# Patient Record
Sex: Female | Born: 1995 | Race: White | Hispanic: No | Marital: Single | Smoking: Never smoker
Health system: Southern US, Community
[De-identification: ages and names within clinical notes are randomized; demographics above are authoritative.]

## PROBLEM LIST (undated history)

## (undated) ENCOUNTER — Emergency Department: Admission: EM | Payer: 59 | Source: Home / Self Care

## (undated) DIAGNOSIS — N200 Calculus of kidney: Secondary | ICD-10-CM

---

## 2018-09-20 ENCOUNTER — Emergency Department: Payer: 59

## 2018-09-20 ENCOUNTER — Other Ambulatory Visit: Payer: Self-pay

## 2018-09-20 ENCOUNTER — Emergency Department
Admission: EM | Admit: 2018-09-20 | Discharge: 2018-09-20 | Disposition: A | Discharge status: Home / Self Care | Payer: 59 | Attending: Emergency Medicine | Admitting: Emergency Medicine

## 2018-09-20 ENCOUNTER — Encounter: Payer: Self-pay | Admitting: *Deleted

## 2018-09-20 DIAGNOSIS — R079 Chest pain, unspecified: Secondary | ICD-10-CM | POA: Insufficient documentation

## 2018-09-20 LAB — CBC
HCT: 36.8 % (ref 36.0–46.0)
Hemoglobin: 11.8 g/dL — ABNORMAL LOW (ref 12.0–15.0)
MCH: 25.8 pg — ABNORMAL LOW (ref 26.0–34.0)
MCHC: 32.1 g/dL (ref 30.0–36.0)
MCV: 80.5 fL (ref 80.0–100.0)
Platelets: 281 10*3/uL (ref 150–400)
RBC: 4.57 MIL/uL (ref 3.87–5.11)
RDW: 13.2 % (ref 11.5–15.5)
WBC: 6.1 10*3/uL (ref 4.0–10.5)
nRBC: 0 % (ref 0.0–0.2)

## 2018-09-20 LAB — BASIC METABOLIC PANEL
Anion gap: 7 (ref 5–15)
BUN: 8 mg/dL (ref 6–20)
CO2: 28 mmol/L (ref 22–32)
Calcium: 9.2 mg/dL (ref 8.9–10.3)
Chloride: 104 mmol/L (ref 98–111)
Creatinine, Ser: 0.64 mg/dL (ref 0.44–1.00)
GFR calc Af Amer: >60 mL/min (ref >60)
GFR calc non Af Amer: >60 mL/min (ref >60)
Glucose, Bld: 103 mg/dL — ABNORMAL HIGH (ref 70–99)
Potassium: 2.9 mmol/L — ABNORMAL LOW (ref 3.5–5.1)
Sodium: 139 mmol/L (ref 135–145)

## 2018-09-20 LAB — TROPONIN I
Troponin I: <0.03 ng/mL (ref <0.03)
Troponin I: <0.03 ng/mL (ref <0.03)

## 2018-09-20 MED ORDER — POTASSIUM CHLORIDE CRYS ER 20 MEQ PO TBCR: 40.0000 meq | EXTENDED_RELEASE_TABLET | Freq: Once | ORAL | Status: DC

## 2018-09-20 MED ORDER — IBUPROFEN 600 MG PO TABS
600.0000 mg | ORAL_TABLET | Freq: Once | ORAL | Status: AC
  Administered 2018-09-20: 600 mg via ORAL
  Filled 2018-09-20: qty 1

## 2018-09-20 MED ORDER — POTASSIUM CHLORIDE CRYS ER 20 MEQ PO TBCR
40.0000 meq | EXTENDED_RELEASE_TABLET | Freq: Once | ORAL | Status: AC
  Administered 2018-09-20: 40 meq via ORAL
  Filled 2018-09-20: qty 2

## 2018-09-20 MED ORDER — IBUPROFEN 600 MG PO TABS: 600.0000 mg | ORAL_TABLET | Freq: Four times a day (QID) | ORAL | 0 refills | Status: DC | PRN

## 2018-09-20 NOTE — ED Triage Notes (Addendum)
Pt ambulatory to triage. Pt was seen at the urgent care today and sent to the ER for eval of chest pain   Pt has chest pain for 3 days.  No sob.  Nonsmoker.  No cough.  Pt alert  Speech clear.

## 2018-09-20 NOTE — Discharge Instructions (Addendum)

## 2018-09-20 NOTE — ED Provider Notes (Signed)
[facility] Emergency Department Provider Note  ____________________________________________  Time seen: Approximately 9:50 PM  I have reviewed the triage vital signs and the nursing notes.   HISTORY  Chief Complaint Chest Pain   HPI Kristi Rhodes is a 22 y.o. female with no significant past medical history presents for evaluation of chest pain.  Patient reports that 3 days ago she woke up feeling nauseous and had one episode of vomiting.  After that she developed sharp intermittent diffuse chest pain.  The pain has been intermittent for the last 3 days currently moderate in intensity.  The pain is nonpleuritic in nature.  She reports that the pain can come on both at rest or with movement, nothing that she does makes it better or worse.  No cough or congestion, no fever chills, no shortness of breath, no wheezing, no abdominal pain episodes of nausea or vomiting.  Patient denies personal or family history of heart attacks, DVT or PE, recent travel immobilization, leg pain or swelling, hemoptysis, exogenous hormones, or history of cancer.  She has not tried anything for pain.  She went to urgent care today to be evaluated for the chest pain and she was sent to the emergency room for evaluation. She denies smoking or drugs.    PMH None - reviewed  Allergies Patient has no known allergies.  FH No PE/ DVT, or ACS  Social History Social History   Tobacco Use  . Smoking status: Never Smoker  . Smokeless tobacco: Never Used  Substance Use Topics  . Alcohol use: Yes  . Drug use: Not on file    Review of Systems  Constitutional: Negative for fever. Eyes: Negative for visual changes. ENT: Negative for sore throat. Neck: No neck pain  Cardiovascular: + chest pain. Respiratory: Negative for shortness of breath. Gastrointestinal: Negative for abdominal pain, vomiting or diarrhea. Genitourinary: Negative for dysuria. Musculoskeletal: Negative for back  pain. Skin: Negative for rash. Neurological: Negative for headaches, weakness or numbness. Psych: No SI or HI  ____________________________________________   PHYSICAL EXAM:  VITAL SIGNS: ED Triage Vitals  Enc Vitals Group     BP 09/20/18 1809 124/65     Pulse Rate 09/20/18 1809 74     Resp 09/20/18 1809 16     Temp 09/20/18 1809 98.3 F (36.8 C)     Temp Source 09/20/18 1809 Oral     SpO2 09/20/18 1809 100 %     Weight 09/20/18 1804 175 lb (79.4 kg)     Height 09/20/18 1804 5' (1.524 m)     Head Circumference --      Peak Flow --      Pain Score 09/20/18 1804 7     Pain Loc --      Pain Edu? --      Excl. in GC? --     Constitutional: Alert and oriented. Well appearing and in no apparent distress. HEENT:      Head: Normocephalic and atraumatic.         Eyes: Conjunctivae are normal. Sclera is non-icteric.       Mouth/Throat: Mucous membranes are moist.       Neck: Supple with no signs of meningismus. Cardiovascular: Regular rate and rhythm. No murmurs, gallops, or rubs. 2+ symmetrical distal pulses are present in all extremities. No JVD.  Palpation of the chest wall reproduces the pain in midline, there is no crepitus Respiratory: Normal respiratory effort. Lungs are clear to auscultation bilaterally. No wheezes, crackles,  or rhonchi.  Gastrointestinal: Soft, non tender, and non distended with positive bowel sounds. No rebound or guarding. Musculoskeletal: Nontender with normal range of motion in all extremities. No edema, cyanosis, or erythema of extremities. Neurologic: Normal speech and language. Face is symmetric. Moving all extremities. No gross focal neurologic deficits are appreciated. Skin: Skin is warm, dry and intact. No rash noted. Psychiatric: Mood and affect are normal. Speech and behavior are normal.  ____________________________________________   LABS (all labs ordered are listed, but only abnormal results are displayed)  Labs Reviewed  BASIC METABOLIC  PANEL - Abnormal; Notable for the following components:      Result Value   Potassium 2.9 (*)    Glucose, Bld 103 (*)    All other components within normal limits  CBC - Abnormal; Notable for the following components:   Hemoglobin 11.8 (*)    MCH 25.8 (*)    All other components within normal limits  TROPONIN I  TROPONIN I   ____________________________________________  EKG  ED ECG REPORT I, Nita Sicklearolina Shota Kohrs, the attending physician, personally viewed and interpreted this ECG.  Normal sinus rhythm, rate of 81, normal intervals, normal axis, no ST elevations or depressions, T wave inversion in lead III.  No prior for comparison. ____________________________________________  RADIOLOGY  I have personally reviewed the images performed during this visit and I agree with the Radiologist's read.   Interpretation by Radiologist:  Dg Chest 2 View  Result Date: 09/20/2018 CLINICAL DATA:  Patient reports she was sent here from urgent care for evaluation of chest pain. Patient reports she has had generalized chest pain x 3 days. States the pain is not improving. Patient denies all other symptoms. EXAM: CHEST - 2 VIEW COMPARISON:  None. FINDINGS: The heart size and mediastinal contours are within normal limits. Both lungs are clear. The visualized skeletal structures are unremarkable. IMPRESSION: No active cardiopulmonary disease. Electronically Signed   By: Norva PavlovElizabeth  Brown M.D.   On: 09/20/2018 18:43     ____________________________________________   PROCEDURES  Procedure(s) performed: None Procedures Critical Care performed:  None ____________________________________________   INITIAL IMPRESSION / ASSESSMENT AND PLAN / ED COURSE  22 y.o. female with no significant past medical history presents for evaluation of chest pain. Chest pain in a 22 y.o. female with low suspicion for cardiac (HEART score 0) or other serious etiology (including aortic dissection, pneumonia, pneumothorax,  pericarditis, myocarditis, Boerhaave's syndrome, or pulmonary embolism) based on  her history and physical exam in the ED today. EKG normal. Plan for labs including CBC, chemistries and troponin x 2 Q3 hours, CXR and re-evaluation for disposition. Will give full dose ibuprofen for costochondritis vs pleurisy. Will observe patient on cardiac monitor while in the ED and pain control.       _________________________ 11:09 PM on 09/20/2018 -----------------------------------------  Second troponin is negative.  Patient remains well-appearing.  Will discharge home on ibuprofen and follow-up with primary care doctor.  Discussed standard return precautions.   As part of my medical decision making, I reviewed the following data within the electronic MEDICAL RECORD NUMBER Nursing notes reviewed and incorporated, Labs reviewed , EKG interpreted , Radiograph reviewed , Notes from prior ED visits and [place] Controlled Substance Database    Pertinent labs & imaging results that were available during my care of the patient were reviewed by me and considered in my medical decision making (see chart for details).    ____________________________________________   FINAL CLINICAL IMPRESSION(S) / ED DIAGNOSES  Final diagnoses:  Chest  pain, unspecified type      NEW MEDICATIONS STARTED DURING THIS VISIT:  ED Discharge Orders         Ordered    ibuprofen (ADVIL,MOTRIN) 600 MG tablet  Every 6 hours PRN     09/20/18 2307           Note:  This document was prepared using Dragon voice recognition software and may include unintentional dictation errors.    Nita Sickle, MD 09/20/18 [phone]

## 2019-02-05 ENCOUNTER — Other Ambulatory Visit
Admission: RE | Admit: 2019-02-05 | Discharge: 2019-02-05 | Disposition: A | Discharge status: Home / Self Care | Payer: 59 | Attending: Emergency Medicine | Admitting: Emergency Medicine
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
## 2019-11-15 IMAGING — CR DG CHEST 2V
1 series · 2 of 2 positions shown · non-contrast
Comparison: None.

CLINICAL DATA: Patient reports she was sent here from [HOSPITAL]
for evaluation of chest pain. Patient reports she has had
generalized chest pain x 3 days. States the pain is not improving.
Patient denies all other symptoms.

EXAM:
CHEST - 2 VIEW

[Series 1: w chest pa · 0.14mm/px · 2 of 2 slices shown]
[im 1/2]
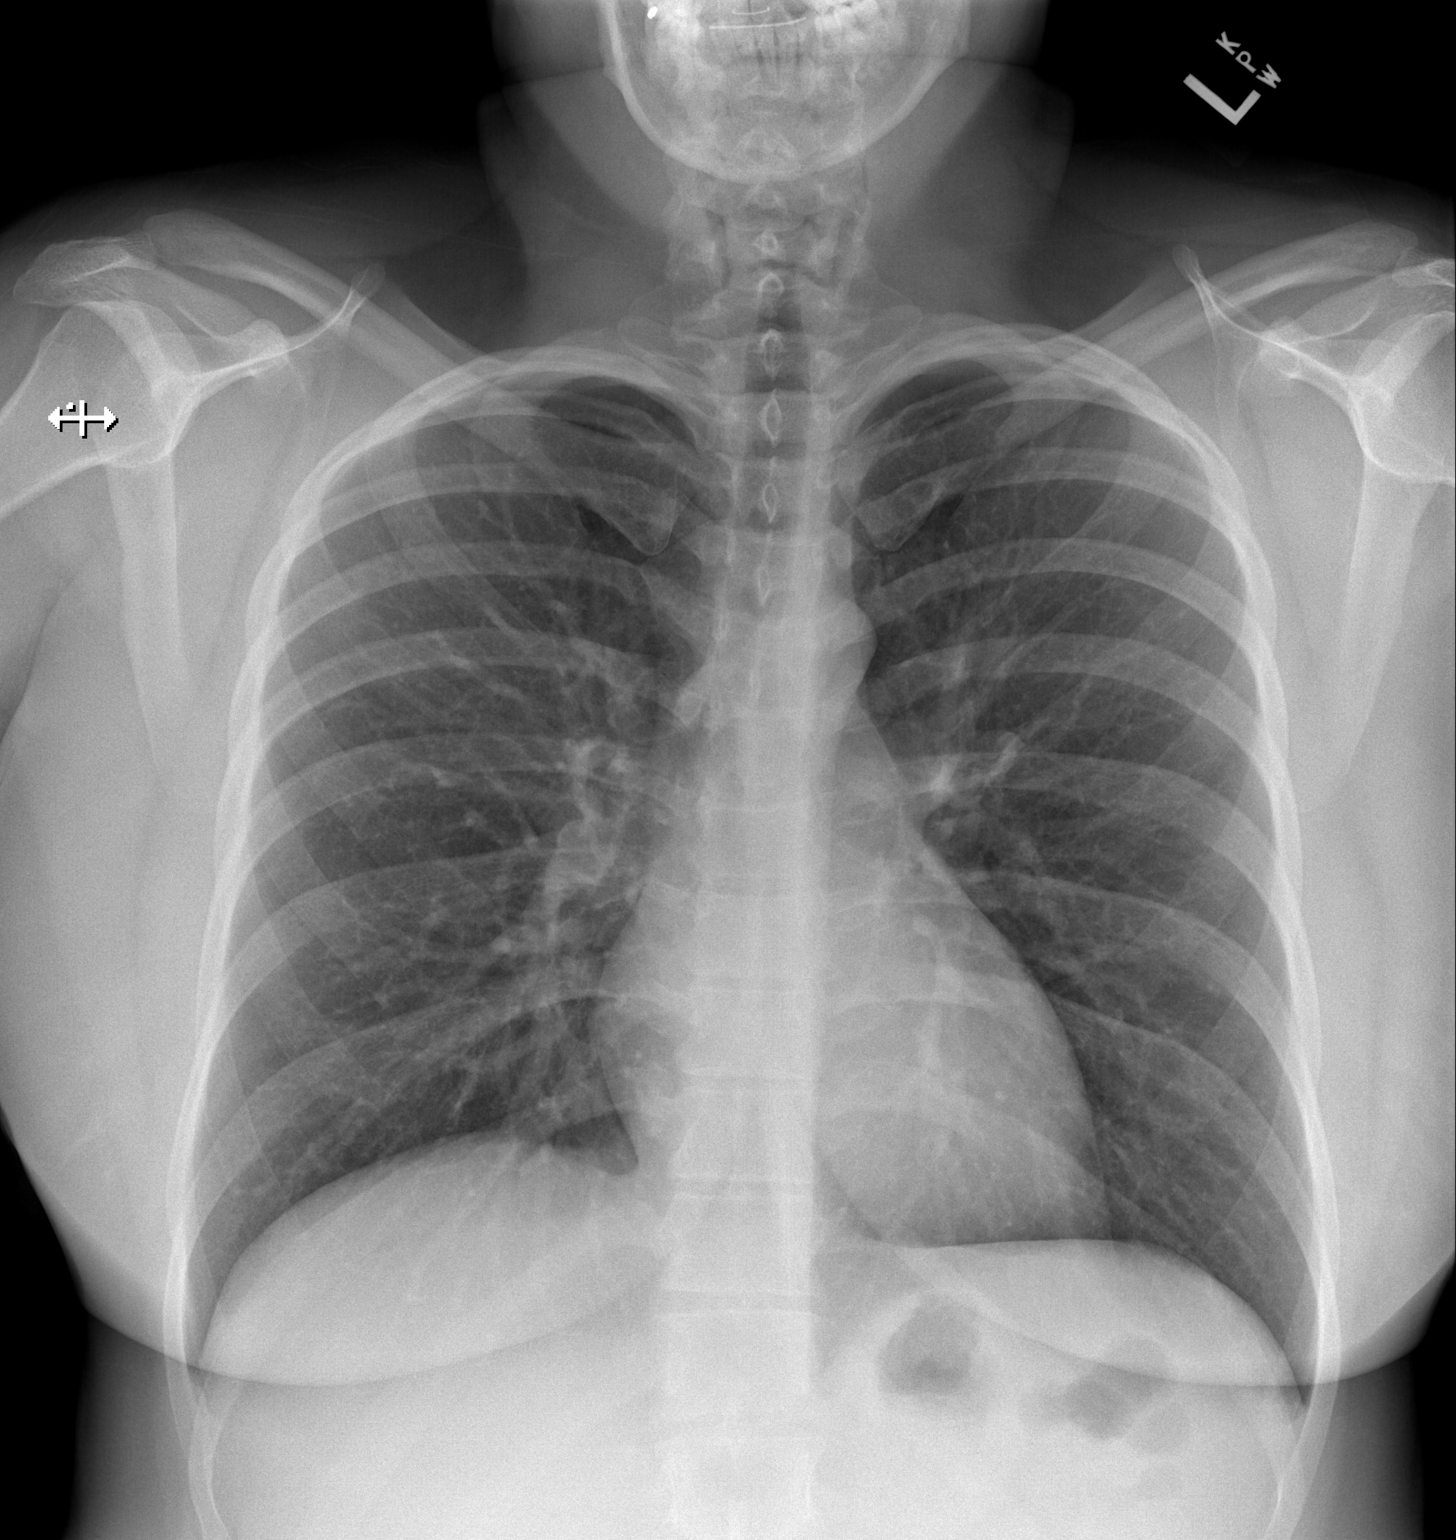
[im 2/2]
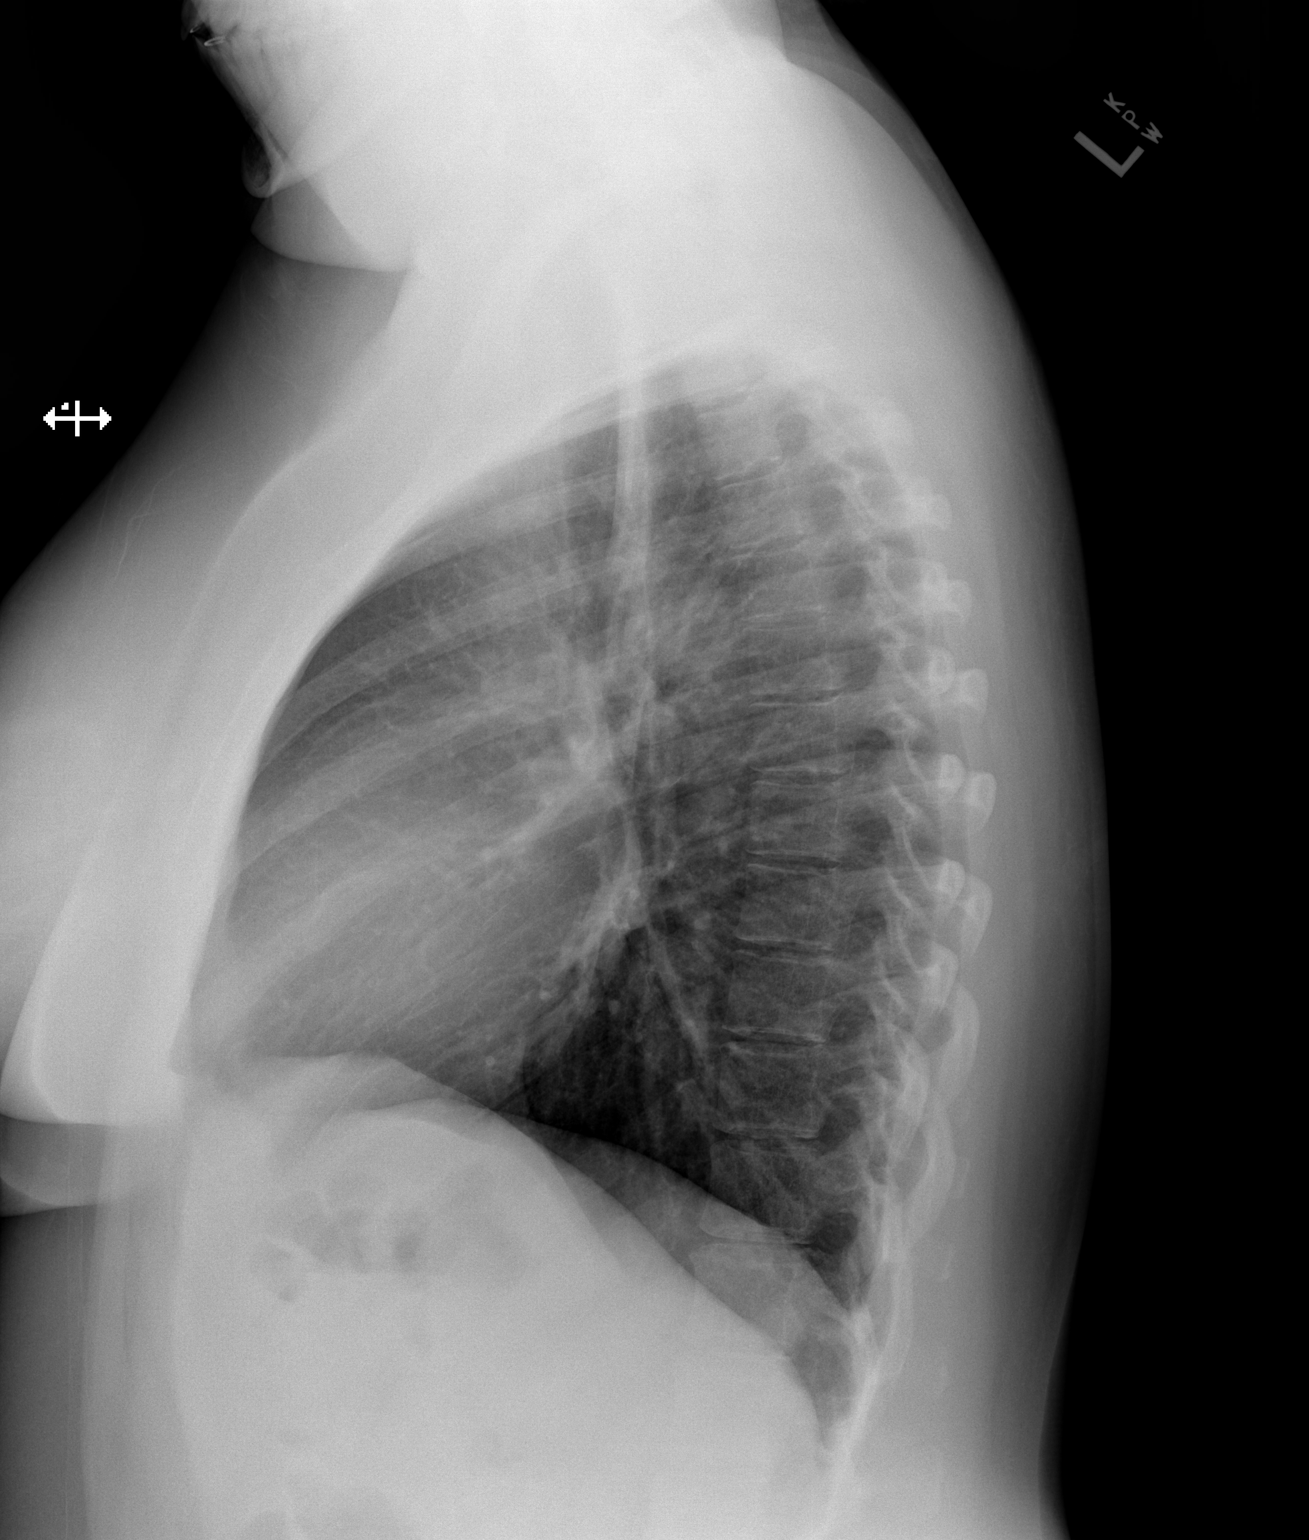

[2 of 2 positions shown; findings below may reference images not displayed]

FINDINGS: The heart size and mediastinal contours are within normal limits.
Both lungs are clear. The visualized skeletal structures are
unremarkable.
IMPRESSION: No active cardiopulmonary disease.

## 2020-07-06 ENCOUNTER — Encounter: Payer: 59 | Admitting: Nurse Practitioner

## 2021-03-01 ENCOUNTER — Other Ambulatory Visit: Payer: Self-pay | Admitting: Obstetrics & Gynecology

## 2021-03-30 ENCOUNTER — Encounter: Payer: Self-pay | Admitting: Obstetrics & Gynecology

## 2021-03-30 ENCOUNTER — Ambulatory Visit (INDEPENDENT_AMBULATORY_CARE_PROVIDER_SITE_OTHER): Payer: Managed Care, Other (non HMO) | Admitting: Obstetrics & Gynecology

## 2021-03-30 ENCOUNTER — Other Ambulatory Visit (HOSPITAL_COMMUNITY)
Admission: RE | Admit: 2021-03-30 | Discharge: 2021-03-30 | Disposition: A | Discharge status: Home / Self Care | Payer: Managed Care, Other (non HMO) | Source: Ambulatory Visit | Attending: Obstetrics & Gynecology | Admitting: Obstetrics & Gynecology

## 2021-03-30 ENCOUNTER — Other Ambulatory Visit: Payer: Self-pay

## 2021-03-30 VITALS — Ht 61.0 in | Wt 195.0 lb

## 2021-03-30 DIAGNOSIS — Z01419 Encounter for gynecological examination (general) (routine) without abnormal findings: Secondary | ICD-10-CM | POA: Diagnosis present

## 2021-03-30 NOTE — Progress Notes (Signed)
Subjective:     Kristi Rhodes is a 25 y.o. female here for a routine exam.  Patient's last menstrual period was 03/03/2021 (exact Rhodes). [id number] Birth Control Method:  none, not sexually active,  Menstrual Calendar(currently): regular  Current complaints: none.   Current acute medical issues:  none   Recent Gynecologic History Patient's last menstrual period was 03/03/2021 (exact Rhodes). Last Pap: never,   Last mammogram: n/a,    History reviewed. No pertinent past medical history.  History reviewed. No pertinent surgical history.  OB History     Gravida  0   Para  0   Term  0   Preterm  0   AB  0   Living  0      SAB  0   IAB  0   Ectopic  0   Multiple  0   Live Births  0           Social History   Socioeconomic History   Marital status: Single    Spouse name: Not on file   Number of children: Not on file   Years of education: Not on file   Highest education level: Not on file  Occupational History   Not on file  Tobacco Use   Smoking status: Never   Smokeless tobacco: Never  Vaping Use   Vaping Use: Never used  Substance and Sexual Activity   Alcohol use: Yes   Drug use: Never   Sexual activity: Never    Birth control/protection: None  Other Topics Concern   Not on file  Social History Narrative   Not on file   Social Determinants of Health   Financial Resource Strain: Low Risk    Difficulty of Paying Living Expenses: Not hard at all  Food Insecurity: No Food Insecurity   Worried About Programme researcher, broadcasting/film/video in the Last Year: Never true   Ran Out of Food in the Last Year: Never true  Transportation Needs: No Transportation Needs   Lack of Transportation (Medical): No   Lack of Transportation (Non-Medical): No  Physical Activity: Insufficiently Active   Days of Exercise per Week: 3 days   Minutes of Exercise per Session: 30 min  Stress: Stress Concern Present   Feeling of Stress : To some extent  Social Connections: Moderately  Isolated   Frequency of Communication with Friends and Family: More than three times a week   Frequency of Social Gatherings with Friends and Family: Three times a week   Attends Religious Services: 1 to 4 times per year   Active Member of Clubs or Organizations: No   Attends Banker Meetings: Never   Marital Status: Never married    Family History  Problem Relation Age of Onset   Diabetes Father    Thyroid disease Mother     No current outpatient medications on file.  Review of Systems  Review of Systems  Constitutional: Negative for fever, chills, weight loss, malaise/fatigue and diaphoresis.  HENT: Negative for hearing loss, ear pain, nosebleeds, congestion, sore throat, neck pain, tinnitus and ear discharge.   Eyes: Negative for blurred vision, double vision, photophobia, pain, discharge and redness.  Respiratory: Negative for cough, hemoptysis, sputum production, shortness of breath, wheezing and stridor.   Cardiovascular: Negative for chest pain, palpitations, orthopnea, claudication, leg swelling and PND.  Gastrointestinal: negative for abdominal pain. Negative for heartburn, nausea, vomiting, diarrhea, constipation, blood in stool and melena.  Genitourinary: Negative for dysuria, urgency, frequency, hematuria and  flank pain.  Musculoskeletal: Negative for myalgias, back pain, joint pain and falls.  Skin: Negative for itching and rash.  Neurological: Negative for dizziness, tingling, tremors, sensory change, speech change, focal weakness, seizures, loss of consciousness, weakness and headaches.  Endo/Heme/Allergies: Negative for environmental allergies and polydipsia. Does not bruise/bleed easily.  Psychiatric/Behavioral: Negative for depression, suicidal ideas, hallucinations, memory loss and substance abuse. The patient is not nervous/anxious and does not have insomnia.        Objective:  Height 5\' 1"  (1.549 m), weight 195 lb (88.5 kg), last menstrual period  03/03/2021.   Physical Exam  Vitals reviewed. Constitutional: She is oriented to person, place, and time. She appears well-developed and well-nourished.  HENT:  Head: Normocephalic and atraumatic.        Right Ear: External ear normal.  Left Ear: External ear normal.  Nose: Nose normal.  Mouth/Throat: Oropharynx is clear and moist.  Eyes: Conjunctivae and EOM are normal. Pupils are equal, round, and reactive to light. Right eye exhibits no discharge. Left eye exhibits no discharge. No scleral icterus.  Neck: Normal range of motion. Neck supple. No tracheal deviation present. No thyromegaly present.  Cardiovascular: Normal rate, regular rhythm, normal heart sounds and intact distal pulses.  Exam reveals no gallop and no friction rub.   No murmur heard. Respiratory: Effort normal and breath sounds normal. No respiratory distress. She has no wheezes. She has no rales. She exhibits no tenderness.  GI: Soft. Bowel sounds are normal. She exhibits no distension and no mass. There is no tenderness. There is no rebound and no guarding.  Genitourinary:  Breasts no masses skin changes or nipple changes bilaterally      Vulva is normal without lesions Vagina is pink moist without discharge Cervix normal in appearance and pap is done Uterus is normal size shape and contour Adnexa is negative with normal sized ovaries   Musculoskeletal: Normal range of motion. She exhibits no edema and no tenderness.  Neurological: She is alert and oriented to person, place, and time. She has normal reflexes. She displays normal reflexes. No cranial nerve deficit. She exhibits normal muscle tone. Coordination normal.  Skin: Skin is warm and dry. No rash noted. No erythema. No pallor.  Psychiatric: She has a normal mood and affect. Her behavior is normal. Judgment and thought content normal.       Medications Ordered at today's visit: No orders of the defined types were placed in this encounter.   Other orders  placed at today's visit: No orders of the defined types were placed in this encounter.     Assessment:    Normal Gyn exam.    Plan:    Contraception: abstinence. Follow up in: 5 years.     Return in about 5 years (around 03/30/2026) for yearly.

## 2021-03-31 LAB — CYTOLOGY - PAP
Comment: NEGATIVE
Diagnosis: NEGATIVE
High risk HPV: NEGATIVE

## 2021-04-20 ENCOUNTER — Ambulatory Visit (INDEPENDENT_AMBULATORY_CARE_PROVIDER_SITE_OTHER): Payer: 59 | Admitting: Psychiatry

## 2021-04-20 ENCOUNTER — Other Ambulatory Visit: Payer: Self-pay

## 2021-04-20 DIAGNOSIS — F321 Major depressive disorder, single episode, moderate: Secondary | ICD-10-CM
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
## 2021-04-30 ENCOUNTER — Ambulatory Visit: Payer: 59 | Admitting: Adult Health

## 2021-04-30 ENCOUNTER — Other Ambulatory Visit: Payer: Self-pay

## 2021-04-30 ENCOUNTER — Encounter: Payer: Self-pay | Admitting: Adult Health

## 2021-04-30 VITALS — BP 124/74 | HR 81 | Ht 61.5 in | Wt 194.6 lb

## 2021-04-30 DIAGNOSIS — F321 Major depressive disorder, single episode, moderate: Secondary | ICD-10-CM | POA: Diagnosis not present

## 2021-04-30 MED ORDER — TRAZODONE HCL 50 MG PO TABS: 50.0000 mg | ORAL_TABLET | Freq: Every day | ORAL | 2 refills | Status: DC

## 2021-04-30 MED ORDER — SERTRALINE HCL 50 MG PO TABS: 50.0000 mg | ORAL_TABLET | Freq: Every day | ORAL | 2 refills | Status: DC

## 2021-04-30 NOTE — Progress Notes (Signed)
[facility] MD/PA/NP Initial Note  04/30/2021 12:16 PM Kristi Rhodes  MRN:  [id number]  Chief Complaint:   HPI:   Describes mood today as "not the best". Tearful at times. Mood symptoms - reports depression, anxiety, and irritability. More depressed overall. 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 Stating "my symptoms started about 3 months ago after ending a toxic relationship". Was in the relationship for 3 years. Stating "I didn't realize how toxic it was". Started seeing Rockne Menghini 2 weeks ago - has an upcoming appointment. Has been isolating more. Has a friend that she talks too. Stable interest and motivation. Taking medications as prescribed.  Energy levels lower for the past three months. Active, does not have a regular exercise routine.  Enjoys some usual interests and activities. Single. Lives alone. Parents in [place], [place]. 2 brother in [place]. Spending time with family and friends. Upcoming beach trip. 2 trips planned for [place]. Appetite adequate - eating once a day. Weight stable - 194.6 pounds. Sleeping difficulties. Averages 4 to 5 hours. Working alternating shift. Focus and concentration difficulties. Completing tasks. Managing aspects of household. 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  Denies AH or VH.  Previous medication trials: Denies  Visit Diagnosis:    ICD-10-CM   1. Current moderate episode of major depressive disorder without prior episode (HCC)  F32.1 sertraline (ZOLOFT) 50 MG tablet    traZODone (DESYREL) 50 MG tablet      Past Psychiatric History: Denies psychiatric hospitalization.   Past Medical History: No past medical history on file. No past surgical history on file.  Family Psychiatric History: Denies any family history of mental illness.   Family History:  Family History  Problem Relation Age of Onset   Diabetes Father    Thyroid disease Mother     Social History:  Social History   Socioeconomic History   Marital status: Single     Spouse name: Not on file   Number of children: Not on file   Years of education: Not on file   Highest education level: Not on file  Occupational History   Not on file  Tobacco Use   Smoking status: Never   Smokeless tobacco: Never  Vaping Use   Vaping Use: Never used  Substance and Sexual Activity   Alcohol use: Yes   Drug use: Never   Sexual activity: Never    Birth control/protection: None  Other Topics Concern   Not on file  Social History Narrative   Not on file   Social Determinants of Health   Financial Resource Strain: Low Risk    Difficulty of Paying Living Expenses: Not hard at all  Food Insecurity: No Food Insecurity   Worried About Programme researcher, broadcasting/film/video in the Last Year: Never true   Ran Out of Food in the Last Year: Never true  Transportation Needs: No Transportation Needs   Lack of Transportation (Medical): No   Lack of Transportation (Non-Medical): No  Physical Activity: Insufficiently Active   Days of Exercise per Week: 3 days   Minutes of Exercise per Session: 30 min  Stress: Stress Concern Present   Feeling of Stress : To some extent  Social Connections: Moderately Isolated   Frequency of Communication with Friends and Family: More than three times a week   Frequency of Social Gatherings with Friends and Family: Three times a week   Attends Religious Services: 1 to 4 times per year   Active Member of Clubs or Organizations: No  Attends Banker Meetings: Never   Marital Status: Never married    Allergies: No Known Allergies  Metabolic Disorder Labs: No results found for: HGBA1C, MPG No results found for: PROLACTIN No results found for: CHOL, TRIG, HDL, CHOLHDL, VLDL, LDLCALC No results found for: TSH  Therapeutic Level Labs: No results found for: LITHIUM No results found for: VALPROATE No components found for:  CBMZ  Current Medications: Current Outpatient Medications  Medication Sig Dispense Refill   sertraline (ZOLOFT) 50 MG  tablet Take 1 tablet (50 mg total) by mouth daily. 30 tablet 2   traZODone (DESYREL) 50 MG tablet Take 1 tablet (50 mg total) by mouth at bedtime. 30 tablet 2   No current facility-administered medications for this visit.    Medication Side Effects: none  Orders placed this visit:  No orders of the defined types were placed in this encounter.   Psychiatric Specialty Exam:  Review of Systems  Blood pressure 124/74, pulse 81, height 5' 1.5" (1.562 m), weight 194 lb 9.6 oz (88.3 kg).Body mass index is 36.17 kg/m.  General Appearance: Neat and Well Groomed  Eye Contact:  Good  Speech:  Clear and Coherent and Normal Rate  Volume:  Normal  Mood:  Euthymic  Affect:  Appropriate and Congruent  Thought Process:  Coherent and Descriptions of Associations: Intact  Orientation:  Full (Time, Place, and Person)  Thought Content: Logical   Suicidal Thoughts:  No  Homicidal Thoughts:  No  Memory:  WNL  Judgement:  Good  Insight:  Good  Psychomotor Activity:  Normal  Concentration:  Concentration: Good  Recall:  Good  Fund of Knowledge: Good  Language: Good  Assets:  Communication Skills Desire for Improvement Financial Resources/Insurance Housing Intimacy Leisure Time Physical Health Resilience Social Support Talents/Skills Transportation Vocational/Educational  ADL's:  Intact  Cognition: WNL  Prognosis:  Good   Screenings:  GAD-7    Flowsheet Row Office Visit from 03/30/2021 in [facility]  Total GAD-7 Score 0      PHQ2-9    Flowsheet Row Office Visit from 03/30/2021 in [facility]  PHQ-2 Total Score 0  PHQ-9 Total Score 0       Receiving Psychotherapy: Yes   Treatment Plan/Recommendations:   Plan:  PDMP reviewed  Add Zoloft 50mg  daily - 1/2 tablet daily x 7 days, then one tablet daily Add Trazadone 50mg  at bedtime - 1/2 to one tablet at hs   Time spent with patient was 60 minutes. Greater than 50% of face to face time with patient  was spent on counseling and coordination of care.   RTC 4 weeks  Patient advised to contact office with any questions, adverse effects, or acute worsening in signs and symptoms.      , NP

## 2021-05-03 ENCOUNTER — Ambulatory Visit (INDEPENDENT_AMBULATORY_CARE_PROVIDER_SITE_OTHER): Payer: 59 | Admitting: Psychiatry

## 2021-05-03 ENCOUNTER — Other Ambulatory Visit: Payer: Self-pay

## 2021-05-03 DIAGNOSIS — F321 Major depressive disorder, single episode, moderate: Secondary | ICD-10-CM | POA: Diagnosis not present

## 2021-05-03 NOTE — Progress Notes (Signed)
[facility] Counselor/Therapist Progress Note  Patient ID: Kristi Rhodes, MRN: [id number],    Date: 05/03/2021  Time Spent: 58 minutes   Treatment Type: Individual Therapy  Reported Symptoms: anxiety, depression  Mental Status Exam:  Appearance:   Casual and Neat     Behavior:  Appropriate, Sharing, and Motivated  Motor:  Normal  Speech/Language:   Clear and Coherent and Normal Rate  Affect:  Anxious, depressed  Mood:  anxious and depressed  Thought process:  normal  Thought content:    Some obsessiveness  Sensory/Perceptual disturbances:    WNL  Orientation:  oriented to person, place, time/date, situation, day of week, month of year, year, and stated date of May 03, 2021  Attention:  Good  Concentration:  Good  Memory:  WNL  Fund of knowledge:   Good  Insight:    Good  Judgment:   Good  Impulse Control:  Good   Risk Assessment: Danger to Self:  No Self-injurious Behavior: No Danger to Others: No Duty to Warn:no Physical Aggression / Violence:No  Access to Firearms a concern: No  Gang Involvement:No   Subjective: Patient in today reporting anxiety and depression with depression being a little stronger than the anxiety per patient.  Reports that she has more directly address the situation at work with a coworker where there was a prior relationship.  States that she did not receive any type of negative feedback from person involved in that it seems to have helped the problems she was having at work.  States that she was able to emphasize the need for healthier boundaries in the work environment and is hoping the coworker will continue to respect that.  Patient does feel that she has a lot of "negativity in me" and has felt this is due in part to prior toxic relationship with coworker.  Discussed this more at length today, including bits of progress that she is seeing in herself.  Also is wanting to communicate part of what is going on with her to her parents and  discussed that in session today also.  She has been relatively close to her parents, and a little closer to her father but is feeling ready to share some information with them.  Her biggest hesitancy is that it could generate a lot of questions and we talked about ways to communicate her needs to her parents and tactfully explained what would be helpful and what would not be helpful (a lot of questions "as they worry a lot").  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  He is still working through relationship issues that brought her into therapy and feels that she has made some progress with the boundary setting as noted above.  Plans to continue setting limits with the person with whom she had been in a troubled relationship, and feels that that is going to help but knows that "I am going to have to stand firm" and not let up on her boundaries.  Did start some medication just recently after her appointment with med provider earlier and feels that might be helping a little bit.  Interventions: Cognitive Behavioral Therapy and Insight-Oriented  Diagnosis:   ICD-10-CM   1. Current moderate episode of major depressive disorder without prior episode (HCC)  F32.1       Plan:  Patient today showing good motivation and following up on discussion of setting limits with coworker where there had been a difficult relationship in the past, and so far she feels that has gone better at work.  Has also decided to share some information with her parents, as they had been close over the years but she was trying to protect them and not want them to worry.  Also encouraged patient to use some behaviors in between sessions including: More positive self talk, getting outside daily and walk, frequent interaction with friends that are supportive, allow for adequate sleep,  maintaining good relationship with parents, stay in the present focusing on what she can control or change, being able to say no when she needs to say no, seeing the positives within herself, and feeling good about the strength she is showing in working with goal-directed behaviors in the midst of challenging circumstances as she tries to move forward in a more positive direction of improved emotional health.  Review and progress/challenges noted with patient.  Next appointment within 2 weeks.   Mathis Fare, LCSW

## 2021-05-24 ENCOUNTER — Ambulatory Visit: Payer: 59 | Admitting: Psychiatry

## 2021-06-07 ENCOUNTER — Ambulatory Visit: Payer: 59 | Admitting: Adult Health

## 2021-06-07 ENCOUNTER — Other Ambulatory Visit: Payer: Self-pay

## 2021-06-07 ENCOUNTER — Encounter: Payer: Self-pay | Admitting: Adult Health

## 2021-06-07 ENCOUNTER — Ambulatory Visit (INDEPENDENT_AMBULATORY_CARE_PROVIDER_SITE_OTHER): Payer: 59 | Admitting: Psychiatry

## 2021-06-07 DIAGNOSIS — F321 Major depressive disorder, single episode, moderate: Secondary | ICD-10-CM

## 2021-06-07 MED ORDER — SERTRALINE HCL 100 MG PO TABS: 100.0000 mg | ORAL_TABLET | Freq: Every day | ORAL | 0 refills | Status: DC

## 2021-06-07 NOTE — Progress Notes (Signed)
[facility] Counselor/Therapist Progress Note  Patient ID: Kristi Rhodes, MRN: [id number],    Date: 06/07/2021  Time Spent: 58 minutes  Treatment Type: Individual Therapy  Reported Symptoms: anxiety, depression (both improving)  Mental Status Exam:  Appearance:   Casual     Behavior:  Appropriate, Sharing, and anxious, some depression  Motor:  Normal  Speech/Language:   Clear and Coherent  Affect:  Negative  Mood:  depressed and anxious  Thought process:  normal  Thought content:    WNL  Sensory/Perceptual disturbances:    WNL  Orientation:  oriented to person, place, time/date, situation, day of week, month of year, year, and stated date of Aug. 22, 2022  Attention:  Good  Concentration:  Good and Fair  Memory:  WNL  Fund of knowledge:   Good  Insight:    Good  Judgment:   Good  Impulse Control:  Good   Risk Assessment: Danger to Self:  No Self-injurious Behavior: No Danger to Others: No Duty to Warn:no Physical Aggression / Violence:No  Access to Firearms a concern: No  Gang Involvement:No   Subjective:  Patient in today reporting anxiety but "I can tell some improvement with the meds, still some erratic sleep" and is to speak with her med provider today.  Has has another time where person that she has been setting a healthy boundary with, did reach out to patient.  Still trying to maintain healthy boundary with that person and "can tell a positive difference." Self-care and self--talk still on negative side. Blames self for things that aren't her fault, negative self-talk, difficulty accepting when she makes mistakes.  Got switched positions at work and has a new partner starting today.  Needed session today to discuss her frustration, discontent, lack of motivation for work, and self-blame/"negativity within me leftover from a toxic relationship previously". Processed a lot of her thoughts and feelings re: her work situation and the changes taking place. (Not all  details included in note due to patient privacy needs.) Thinking about sharing some of her situation with her brother and parents and discussed how she might do this. On 1-10 scale for depression she rates herself a 6 or 7.  (Was a 9/10 a couple mos ago). On 1-10 scale for anxiety she rates self a 3/4, and a couple mos ago was "about an 8".  To continue with goal-directed behaviors discussed in session today and stand firm with her boundaries that have been set especially at work.  Also to continue on her medication as prescribed and will be speaking with her med provider today.   Interventions: Cognitive Behavioral Therapy and Insight-Oriented  Diagnosis:   ICD-10-CM   1. Current moderate episode of major depressive disorder without prior episode (HCC)  F32.1       Treatment Goal Plan:  Patient not signing treatment goal plan on computer screen due to COVID. Treatment Goals: Treatment goals remain on treatment plan as patient works with strategies to achieve her goals.  Progress will be noted each session and documented in the "progress" section of note. Long term goal: Develop healthy cognitive patterns and beliefs about self in the world that lead to alleviation and help prevent the relapse of depressive symptoms.  Patient will stop unhealthy self talk. Short term goal: Verbally identify, if possible, the source of depressed mood.  Work with CBT and insight oriented strategies to alleviate depression. Strategies: Replace negative self-defeating self-talk with verbalization of realistic and positive cognitive messages.  Better understand the connection between our self-talk and our thoughts.    Plan:  Patient today showing good motivation and continues to follow-up with her goal-directed behaviors and setting appropriate limits when needed as noted above.  Things going better at work and will experience a transition today and having a new partner whom she knows and has good relationship  with.  Did not share information with parents/brother as she had planned previously, however talk through this more today and plans to speak with them in the near future.  Encouraged patient to practice some behaviors that can be helpful to her between sessions including: Being outside some daily away from her job, frequent interaction with friends that are supportive, much more positive self talk, stop self blaming, allow for adequate sleep, stay on medications as prescribed, maintain good relationship with parents and family in the present focusing on what she can control or change, saying no when she needs to say no, seeing the positives within herself, and feeling good about the strength she shows in working with goal-directed behaviors in the midst of challenging circumstances as she tries to move forward in a more positive direction of improved emotional health.  Goal review and progress/challenges noted with patient.  Next appointment within 2 to 3 weeks.   Mathis Fare, LCSW

## 2021-06-07 NOTE — Progress Notes (Signed)
Kristi Rhodes [id number] [birth year] 25 y.o.  Subjective:   Patient ID:  Kristi Rhodes is a 25 y.o. (DOB 04-Jun-1996) female.  Chief Complaint: No chief complaint on file.   HPI Kristi Rhodes presents to the office today for follow-up of MDD.  Describes mood today as "ok". Tearful at times - "not as often". Mood symptoms - reports deceased depression and. anxiety. Denies irritability. Not worrying as much, but "still there". Stating "I'm having more good days than bad". Feels like medications are helping. Reports some nausea at first, but it passed. Not isolating as much, but "still there". Recent beach trip. Upcoming trip to [place]. Seeing Rockne Menghini. Stable interest and motivation. Taking medications as prescribed.  Energy levels better. Active, does not have a regular exercise routine.  Enjoys some usual interests and activities. Single. Lives alone. Parents in [place], [place]. 2 brother in [place]. Spending time with family and friends.  Appetite improved - eating twice a day. Weight stable - 194.6 pounds. Sleeping better some nights than others - improved overall. Averages 5 to 7 hours. Working alternating shift. Focus and concentration "a little better". Completing tasks. Managing aspects of household. 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  Denies AH or VH.  Previous medication trials: Denies   GAD-7    Flowsheet Row Office Visit from 03/30/2021 in [facility]  Total GAD-7 Score 0      PHQ2-9    Flowsheet Row Office Visit from 03/30/2021 in [facility]  PHQ-2 Total Score 0  PHQ-9 Total Score 0        Review of Systems:  Review of Systems  Musculoskeletal:  Negative for gait problem.  Neurological:  Negative for tremors.  Psychiatric/Behavioral:         Please refer to HPI   Medications: I have reviewed the patient's current medications.  Current Outpatient Medications  Medication Sig Dispense Refill   sertraline (ZOLOFT) 100 MG  tablet Take 1 tablet (100 mg total) by mouth daily. 90 tablet 0   traZODone (DESYREL) 50 MG tablet Take 1 tablet (50 mg total) by mouth at bedtime. 30 tablet 2   No current facility-administered medications for this visit.    Medication Side Effects: None  Allergies: No Known Allergies  No past medical history on file.  Past Medical History, Surgical history, Social history, and Family history were reviewed and updated as appropriate.   Please see review of systems for further details on the patient's review from today.   Objective:   Physical Exam:  There were no vitals taken for this visit.  Physical Exam Constitutional:      General: She is not in acute distress. Musculoskeletal:        General: No deformity.  Neurological:     Mental Status: She is alert and oriented to person, place, and time.     Coordination: Coordination normal.  Psychiatric:        Attention and Perception: Attention and perception normal. She does not perceive auditory or visual hallucinations.        Mood and Affect: Mood normal. Mood is not anxious or depressed. Affect is not labile, blunt, angry or inappropriate.        Speech: Speech normal.        Behavior: Behavior normal.        Thought Content: Thought content normal. Thought content is not paranoid or delusional. Thought content does not include homicidal or suicidal ideation. Thought content does  not include homicidal or suicidal plan.        Cognition and Memory: Cognition and memory normal.        Judgment: Judgment normal.     Comments: Insight intact    Lab Review:     Component Value Date/Time   NA 139 09/20/2018 1808   K 2.9 (L) 09/20/2018 1808   CL 104 09/20/2018 1808   CO2 28 09/20/2018 1808   GLUCOSE 103 (H) 09/20/2018 1808   BUN 8 09/20/2018 1808   CREATININE 0.64 09/20/2018 1808   CALCIUM 9.2 09/20/2018 1808   GFRNONAA >60 09/20/2018 1808   GFRAA >60 09/20/2018 1808       Component Value Date/Time   WBC 6.1  09/20/2018 1808   RBC 4.57 09/20/2018 1808   HGB 11.8 (L) 09/20/2018 1808   HCT 36.8 09/20/2018 1808   PLT 281 09/20/2018 1808   MCV 80.5 09/20/2018 1808   MCH 25.8 (L) 09/20/2018 1808   MCHC 32.1 09/20/2018 1808   RDW 13.2 09/20/2018 1808    No results found for: POCLITH, LITHIUM   No results found for: PHENYTOIN, PHENOBARB, VALPROATE, CBMZ   .res Assessment: Plan:    Plan:  PDMP reviewed  Increase Zoloft 50mg  to 100mg  daily Continue Trazadone 50mg  at bedtime as needed  RTC 4 weeks  Patient advised to contact office with any questions, adverse effects, or acute worsening in signs and symptoms.    Diagnoses and all orders for this visit:  Current moderate episode of major depressive disorder without prior episode (HCC) -     sertraline (ZOLOFT) 100 MG tablet; Take 1 tablet (100 mg total) by mouth daily.    Please see After Visit Summary for patient specific instructions.  Future Appointments  Date Time Provider Department Center  06/29/2021  8:00 AM , LCSW [facility] None    No orders of the defined types were placed in this encounter.   -------------------------------

## 2021-06-29 ENCOUNTER — Ambulatory Visit: Payer: 59 | Admitting: Psychiatry

## 2021-06-30 ENCOUNTER — Ambulatory Visit: Payer: 59 | Admitting: Adult Health

## 2021-08-05 ENCOUNTER — Other Ambulatory Visit: Payer: Self-pay | Admitting: Adult Health

## 2021-08-05 DIAGNOSIS — F321 Major depressive disorder, single episode, moderate: Secondary | ICD-10-CM

## 2021-08-10 ENCOUNTER — Telehealth: Payer: Self-pay | Admitting: Adult Health

## 2021-08-10 NOTE — Telephone Encounter (Signed)
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  Checked with Provider. Contacted Pt to ask to have it faxed to [phone].

## 2021-08-10 NOTE — Telephone Encounter (Signed)
Received fax from [employer] for provider to complete and fax back. Put in Traci's box.

## 2021-08-10 NOTE — Telephone Encounter (Signed)
Completed and signed. Laying on [employer]

## 2021-08-11 NOTE — Telephone Encounter (Signed)
Forms completed and faxed to [employer]. Pt notified

## 2021-08-17 ENCOUNTER — Other Ambulatory Visit: Payer: Self-pay

## 2021-08-17 DIAGNOSIS — F321 Major depressive disorder, single episode, moderate: Secondary | ICD-10-CM

## 2021-08-18 NOTE — Telephone Encounter (Signed)
Ok to send in a 30 day supply.

## 2021-08-19 MED ORDER — TRAZODONE HCL 50 MG PO TABS: 50.0000 mg | ORAL_TABLET | Freq: Every day | ORAL | 0 refills | Status: DC

## 2021-10-05 ENCOUNTER — Other Ambulatory Visit: Payer: Self-pay

## 2021-10-05 DIAGNOSIS — F321 Major depressive disorder, single episode, moderate: Secondary | ICD-10-CM

## 2021-10-05 MED ORDER — TRAZODONE HCL 50 MG PO TABS: 50.0000 mg | ORAL_TABLET | Freq: Every day | ORAL | 0 refills | Status: DC

## 2021-12-06 ENCOUNTER — Other Ambulatory Visit: Payer: Self-pay

## 2021-12-06 ENCOUNTER — Ambulatory Visit
Admission: EM | Admit: 2021-12-06 | Discharge: 2021-12-06 | Disposition: A | Discharge status: Home / Self Care | Payer: Managed Care, Other (non HMO) | Attending: Family Medicine | Admitting: Family Medicine

## 2021-12-06 DIAGNOSIS — H6983 Other specified disorders of Eustachian tube, bilateral: Secondary | ICD-10-CM

## 2021-12-06 DIAGNOSIS — J309 Allergic rhinitis, unspecified: Secondary | ICD-10-CM

## 2021-12-06 MED ORDER — FLUTICASONE PROPIONATE 50 MCG/ACT NA SUSP: 1.0000 | Freq: Two times a day (BID) | NASAL | 2 refills | Status: DC

## 2021-12-06 MED ORDER — CETIRIZINE HCL 10 MG PO TABS: 10.0000 mg | ORAL_TABLET | Freq: Every day | ORAL | 2 refills | Status: DC

## 2021-12-06 MED ORDER — PREDNISONE 20 MG PO TABS: 40.0000 mg | ORAL_TABLET | Freq: Every day | ORAL | 0 refills | Status: DC

## 2021-12-06 NOTE — ED Provider Notes (Signed)
[facility]    CSN: [id number] Arrival date & time: 12/06/21  0814      History   Chief Complaint Chief Complaint  Patient presents with   Cough    Sinus issues, headache and cough    HPI Kristi Rhodes is a 26 y.o. female.   Presenting today with 2 week history of sinus pain and pressure, nasal congestion, bilateral ear pressure and popping, sinus headache.  Denies fever, chills, cough, chest pain, shortness of breath, abdominal pain, nausea vomiting or diarrhea.  Did a televisit over a week ago and was given Augmentin and Medrol Dosepak which she states did not seem to provide much relief.  She is also been trying the occasional dose of DayQuil or NyQuil with no relief.  No known sick contacts recently.  No known history of seasonal allergies.  History reviewed. No pertinent past medical history.  There are no problems to display for this patient.   History reviewed. No pertinent surgical history.  OB History     Gravida  0   Para  0   Term  0   Preterm  0   AB  0   Living  0      SAB  0   IAB  0   Ectopic  0   Multiple  0   Live Births  0          Home Medications    Prior to Admission medications   Medication Sig Start Date End Date Taking? Authorizing Provider  cetirizine (ZYRTEC ALLERGY) 10 MG tablet Take 1 tablet (10 mg total) by mouth daily. 12/06/21  Yes Volney American, PA-C  fluticasone [facility]) 50 MCG/ACT nasal spray Place 1 spray into both nostrils 2 (two) times daily. 12/06/21  Yes Volney American, PA-C  predniSONE (DELTASONE) 20 MG tablet Take 2 tablets (40 mg total) by mouth daily with breakfast. 12/06/21  Yes Volney American, PA-C  sertraline (ZOLOFT) 100 MG tablet Take 1 tablet (100 mg total) by mouth daily. 06/07/21   Mozingo, Berdie Ogren, NP  traZODone (DESYREL) 50 MG tablet Take 1 tablet (50 mg total) by mouth at bedtime. 10/05/21   Mozingo, Berdie Ogren, NP   Family History Family History   Problem Relation Age of Onset   Diabetes Father    Thyroid disease Mother    Social History Social History   Tobacco Use   Smoking status: Never   Smokeless tobacco: Never  Vaping Use   Vaping Use: Never used  Substance Use Topics   Alcohol use: Yes   Drug use: Never   Allergies   Patient has no known allergies.   Review of Systems Review of Systems Per HPI  Physical Exam Triage Vital Signs ED Triage Vitals  Enc Vitals Group     BP 12/06/21 0834 113/77     Pulse Rate 12/06/21 0834 80     Resp 12/06/21 0834 18     Temp 12/06/21 0834 98.2 F (36.8 C)     Temp Source 12/06/21 0834 Oral     SpO2 12/06/21 0834 97 %     Weight --      Height --      Head Circumference --      Peak Flow --      Pain Score 12/06/21 0833 0     Pain Loc --      Pain Edu? --      Excl. in [place]? --  No data found.  Updated Vital Signs BP 113/77 (BP Location: Right Arm)    Pulse 80    Temp 98.2 F (36.8 C) (Oral)    Resp 18    LMP 11/15/2021 (Exact Date)    SpO2 97%   Visual Acuity Right Eye Distance:   Left Eye Distance:   Bilateral Distance:    Right Eye Near:   Left Eye Near:    Bilateral Near:     Physical Exam Vitals and nursing note reviewed.  Constitutional:      Appearance: Normal appearance.  HENT:     Head: Atraumatic.     Right Ear: External ear normal.     Left Ear: External ear normal.     Ears:     Comments: Bilateral middle ear effusion    Nose: Rhinorrhea present.     Mouth/Throat:     Mouth: Mucous membranes are moist.     Pharynx: Posterior oropharyngeal erythema present.  Eyes:     Extraocular Movements: Extraocular movements intact.     Conjunctiva/sclera: Conjunctivae normal.  Cardiovascular:     Rate and Rhythm: Normal rate and regular rhythm.     Heart sounds: Normal heart sounds.  Pulmonary:     Effort: Pulmonary effort is normal.     Breath sounds: Normal breath sounds. No wheezing or rales.  Musculoskeletal:        General: Normal range  of motion.     Cervical back: Normal range of motion and neck supple.  Skin:    General: Skin is warm and dry.  Neurological:     Mental Status: She is alert and oriented to person, place, and time.  Psychiatric:        Mood and Affect: Mood normal.        Thought Content: Thought content normal.   UC Treatments / Results  Labs (all labs ordered are listed, but only abnormal results are displayed) Labs Reviewed - No data to display  EKG  Radiology No results found.  Procedures Procedures (including critical care time)  Medications Ordered in UC Medications - No data to display  Initial Impression / Assessment and Plan / UC Course  I have reviewed the triage vital signs and the nursing notes.  Pertinent labs & imaging results that were available during my care of the patient were reviewed by me and considered in my medical decision making (see chart for details).     Suspect seasonal allergy related based on symptoms, additionally supported by lack of improvement on antibiotics.  Discussed importance of consistent allergy regimen with Zyrtec, Flonase and will send another steroids to help additionally.  Supportive over-the-counter medications and home care reviewed.  Return for acutely worsening symptoms.  Final Clinical Impressions(s) / UC Diagnoses   Final diagnoses:  Allergic sinusitis  Eustachian tube dysfunction, bilateral   Discharge Instructions   None    ED Prescriptions     Medication Sig Dispense Auth. Provider   predniSONE (DELTASONE) 20 MG tablet Take 2 tablets (40 mg total) by mouth daily with breakfast. 10 tablet Particia Nearing, PA-C   fluticasone [facility]) 50 MCG/ACT nasal spray Place 1 spray into both nostrils 2 (two) times daily. 16 g Particia Nearing, [place]   cetirizine (ZYRTEC ALLERGY) 10 MG tablet Take 1 tablet (10 mg total) by mouth daily. 30 tablet Particia Nearing, [place]      PDMP not reviewed this encounter.   Roosvelt Maser [place], [place] 12/06/21 [phone]

## 2021-12-06 NOTE — ED Triage Notes (Signed)
Patient states she has had a sinus infection for the past 2 weeks   Patient states that she feels like she needs to pop both ears but can not   Patient states she tried Dayquil, Nyquil and Sudafed   Patient states she had some prednisone and amoxicillin prescribed by another Urgent Care without any relief  Denies Fever

## 2022-02-10 NOTE — H&P (View-Only) (Signed)
? ?02/11/2022 ?4:35 PM  ? ?Kristi Rhodes Rohlman ?1995/11/09 ?[id number] ? ?Referring provider:  ?No referring provider defined for this encounter. ?Chief Complaint  ?Patient presents with  ? Nephrolithiasis  ? ? ?HPI: ?Kristi Rhodes is a 26 y.o.female who presents today for further evaluation of kidney stones.  ? ?He was seen in the ED on 02/04/2022 he presents with right flank pain. Urinalysis showed moderate blood, 10 RBCs, 13 WBCs, and occasional bacteria. CT abdomen and pelvis visualized an obstructing 0.4 cm stone within the proximal right ureter with mild right-sided hydronephrosis and additional chronic and incidental findings. He was discharged on Toradol and Flomax.  ? ?KUB today was personally reviewed and interpreted it visualized possible stone in right bony pelvis area  just over sacrum measures 4 mm consistent with stone seen on CT.  ? ?This is her first kidney stones. She reports that her pain is still in the same spot. She has been using pain medicine every 4-6 hours. She has been using Flomax.  She has not been straining consistently.  She does not think she has passed her stone. ? ?No dysuria or gross hematuria.  She was prescribed antibiotics as a precaution in light of her urinalysis. ? ?PMH: ?History reviewed. No pertinent past medical history. ? ?Surgical History: ?History reviewed. No pertinent surgical history. ? ?Home Medications:  ?Allergies as of 02/11/2022   ?No Known Allergies ?  ? ?  ?Medication List  ?  ? ?  ? Accurate as of February 11, 2022 11:59 PM. If you have any questions, ask your nurse or doctor.  ?  ?  ? ?  ? ?cephALEXin 500 MG capsule ?Commonly known as: KEFLEX ?Take 500 mg by mouth 3 (three) times daily. ?  ?cetirizine 10 MG tablet ?Commonly known as: ZyrTEC Allergy ?Take 1 tablet (10 mg total) by mouth daily. ?  ?fluticasone 50 MCG/ACT nasal spray ?Commonly known as: FLONASE ?Place 1 spray into both nostrils 2 (two) times daily. ?  ?HYDROcodone-acetaminophen 5-325 MG tablet ?Commonly  known as: NORCO/VICODIN ?Take 1-2 tablets by mouth every 6 (six) hours as needed for moderate pain. ?Started by: Vanna ScotlandAshley Moncerrath Berhe, MD ?  ?ketorolac 10 MG tablet ?Commonly known as: TORADOL ?Take 10 mg by mouth every 6 (six) hours as needed. ?  ?predniSONE 20 MG tablet ?Commonly known as: DELTASONE ?Take 2 tablets (40 mg total) by mouth daily with breakfast. ?  ?sertraline 100 MG tablet ?Commonly known as: Zoloft ?Take 1 tablet (100 mg total) by mouth daily. ?  ?tamsulosin 0.4 MG Caps capsule ?Commonly known as: FLOMAX ?Take 1 capsule (0.4 mg total) by mouth daily. ?  ?traZODone 50 MG tablet ?Commonly known as: DESYREL ?Take 1 tablet (50 mg total) by mouth at bedtime. ?  ? ?  ? ? ?Allergies:  ?No Known Allergies ? ?Family History: ?Family History  ?Problem Relation Age of Onset  ? Diabetes Father   ? Thyroid disease Mother   ? ? ?Social History:  reports that she has never smoked. She has never used smokeless tobacco. She reports current alcohol use. She reports that she does not use drugs. ? ? ?Physical Exam: ?BP 120/82   Pulse 73   Ht 5\' 1"  (1.549 m)   Wt 200 lb (90.7 kg)   LMP 01/20/2022 Comment: denies preg, signed waiver  BMI 37.79 kg/m?   ?Constitutional: Alert but uncomfortable appearing ?HEENT: [place] AT, moist mucus membranes.  Trachea midline, no masses. ?Cardiovascular: No clubbing, cyanosis, or edema. ?Respiratory: Normal respiratory effort, no  increased work of breathing. ?Skin: No rashes, bruises or suspicious lesions. ?Neurologic: Grossly intact, no focal deficits, moving all 4 extremities. ?Psychiatric: Normal mood and affect. ? ?Laboratory Data: ? ?Lab Results  ?Component Value Date  ? CREATININE 0.64 09/20/2018  ? ?Labs reviewed in care everywhere ? ?Pertinent Imaging: ?CLINICAL INDICATION: 26 year old with eval for renal colic, r - side pan ; Abdominal/flank pain, stone suspected    ? ?COMPARISON: None available.  ? ?TECHNIQUE: A spiral CT scan was obtained without IV contrast from the lung bases to  the pubic symphysis.  Images were reconstructed in the axial plane. Coronal and sagittal reformatted images were also provided for further evaluation.  ? ?Evaluation of the solid organs and vasculature is limited in the absence of intravenous contrast.  ? ? ?FINDINGS:  ? ?LOWER CHEST: No acute abnormality within the visualized thorax.  ? ?LIVER: Normal liver contour. No focal liver lesion on non-contrast examination.  ? ?BILIARY: No intrahepatic biliary ductal dilatation.  Cholelithiasis without gallbladder wall thickening or pericholecystic free fluid.  ? ?SPLEEN: Normal in size and contour. Splenule, measuring 1.7 cm (3:38).  ? ?PANCREAS: Normal pancreatic contour without signs of inflammation or gross ductal dilatation. No peripancreatic fat stranding.  ? ?ADRENAL GLANDS: Normal appearance of the adrenal glands.  ? ?KIDNEYS/URETERS: Smooth renal contours.  Mild right hydronephrosis with 0.4 cm stone in the proximal right ureter (3:67). No additional nephrolithiasis.  ? ?BLADDER: Partially distended.  ? ?REPRODUCTIVE ORGANS: Uterus is present. No adnexal masses. Scattered pelvic phleboliths.  ? ?GI TRACT: Normal course and caliber of the stomach and duodenum. No findings of bowel obstruction or acute inflammation.  Normal appendix (6:95).  ? ?PERITONEUM/RETROPERITONEUM AND MESENTERY: No free air. No ascites. No fluid collection.  ? ?VASCULATURE: Normal caliber aorta. No significant calcified atherosclerosis. Otherwise, limited evaluation without contrast.  ? ?LYMPH NODES: No adenopathy.  ? ?BONES and SOFT TISSUES: No aggressive osseous lesions. No focal soft tissue lesions.  ? ?IMPRESSION:  ?-Obstructing 0.4 cm stone within the proximal right ureter with mild right-sided hydronephrosis.  ?-Additional chronic and incidental findings as characterized in the body of the report. ? ? ?I have personally reviewed the images and agree with radiologist interpretation.  ? ?KUB today was personally reviewed and interpreted  see HPI.  ? ? ?Assessment & Plan:   ?Right ureteral calculus ?-KUB today with a calcification over the right sacrum which does measure about 4 mm which likely represents the stone, continues to have pain and discomfort consistent with retained stone ? ?- We discussed various treatment options for urolithiasis including observation with or without medical expulsive therapy, shockwave lithotripsy (SWL), ureteroscopy and laser lithotripsy with stent placement, and percutaneous nephrolithotomy. ?  ?We discussed that management is based on stone size, location, density, patient co-morbidities, and patient preference.  ?  ?Stones <42mm in size have a >80% spontaneous passage rate. Data surrounding the use of tamsulosin for medical expulsive therapy is controversial, but meta analyses suggests it is most efficacious for distal stones between 5-22mm in size. Possible side effects include dizziness/lightheadedness, and retrograde ejaculation. ?  ?SWL has a lower stone free rate in a single procedure, but also a lower complication rate compared to ureteroscopy and avoids a stent and associated stent related symptoms. Possible complications include renal hematoma, steinstrasse, and need for additional treatment. We discussed the role of his increased skin to stone distance can lead to decreased efficacy with shockwave lithotripsy. ?  ?Ureteroscopy with laser lithotripsy and stent placement has  a higher stone free rate than SWL in a single procedure, however increased complication rate including possible infection, ureteral injury, bleeding, and stent related morbidity. Common stent related symptoms include dysuria, urgency/frequency, and flank pain. ?  ?After an extensive discussion of the risks and benefits of the above treatment options, the patient would like to proceed with ESWL  ? ?- In the interim will refill Flomax and hydrocodone in light of ongoing pain ? ?- Discussed with patient that if they expereince symptoms such  as severe pain , nausea and vomiting in the interim  advise to go to the emergency room  ? ? ?Return for ESWL  ? ?Tawni Millers as a Neurosurgeon for Vanna Scotland, MD.,have documented all relevant docu

## 2022-02-10 NOTE — Progress Notes (Signed)
? ?02/11/2022 ?4:35 PM  ? ?Kristi Rhodes ?02/16/1996 ?[id number] ? ?Referring provider:  ?No referring provider defined for this encounter. ?Chief Complaint  ?Patient presents with  ? Nephrolithiasis  ? ? ?HPI: ?Kristi Rhodes is a 26 y.o.female who presents today for further evaluation of kidney stones.  ? ?He was seen in the ED on 02/04/2022 he presents with right flank pain. Urinalysis showed moderate blood, 10 RBCs, 13 WBCs, and occasional bacteria. CT abdomen and pelvis visualized an obstructing 0.4 cm stone within the proximal right ureter with mild right-sided hydronephrosis and additional chronic and incidental findings. He was discharged on Toradol and Flomax.  ? ?KUB today was personally reviewed and interpreted it visualized possible stone in right bony pelvis area  just over sacrum measures 4 mm consistent with stone seen on CT.  ? ?This is her first kidney stones. She reports that her pain is still in the same spot. She has been using pain medicine every 4-6 hours. She has been using Flomax.  She has not been straining consistently.  She does not think she has passed her stone. ? ?No dysuria or gross hematuria.  She was prescribed antibiotics as a precaution in light of her urinalysis. ? ?PMH: ?History reviewed. No pertinent past medical history. ? ?Surgical History: ?History reviewed. No pertinent surgical history. ? ?Home Medications:  ?Allergies as of 02/11/2022   ?No Known Allergies ?  ? ?  ?Medication List  ?  ? ?  ? Accurate as of February 11, 2022 11:59 PM. If you have any questions, ask your nurse or doctor.  ?  ?  ? ?  ? ?cephALEXin 500 MG capsule ?Commonly known as: KEFLEX ?Take 500 mg by mouth 3 (three) times daily. ?  ?cetirizine 10 MG tablet ?Commonly known as: ZyrTEC Allergy ?Take 1 tablet (10 mg total) by mouth daily. ?  ?fluticasone 50 MCG/ACT nasal spray ?Commonly known as: FLONASE ?Place 1 spray into both nostrils 2 (two) times daily. ?  ?HYDROcodone-acetaminophen 5-325 MG tablet ?Commonly  known as: NORCO/VICODIN ?Take 1-2 tablets by mouth every 6 (six) hours as needed for moderate pain. ?Started by: John Vasconcelos, MD ?  ?ketorolac 10 MG tablet ?Commonly known as: TORADOL ?Take 10 mg by mouth every 6 (six) hours as needed. ?  ?predniSONE 20 MG tablet ?Commonly known as: DELTASONE ?Take 2 tablets (40 mg total) by mouth daily with breakfast. ?  ?sertraline 100 MG tablet ?Commonly known as: Zoloft ?Take 1 tablet (100 mg total) by mouth daily. ?  ?tamsulosin 0.4 MG Caps capsule ?Commonly known as: FLOMAX ?Take 1 capsule (0.4 mg total) by mouth daily. ?  ?traZODone 50 MG tablet ?Commonly known as: DESYREL ?Take 1 tablet (50 mg total) by mouth at bedtime. ?  ? ?  ? ? ?Allergies:  ?No Known Allergies ? ?Family History: ?Family History  ?Problem Relation Age of Onset  ? Diabetes Father   ? Thyroid disease Mother   ? ? ?Social History:  reports that she has never smoked. She has never used smokeless tobacco. She reports current alcohol use. She reports that she does not use drugs. ? ? ?Physical Exam: ?BP 120/82   Pulse 73   Ht 5' 1" (1.549 m)   Wt 200 lb (90.7 kg)   LMP 01/20/2022 Comment: denies preg, signed waiver  BMI 37.79 kg/m?   ?Constitutional: Alert but uncomfortable appearing ?HEENT: [place] AT, moist mucus membranes.  Trachea midline, no masses. ?Cardiovascular: No clubbing, cyanosis, or edema. ?Respiratory: Normal respiratory effort, no   increased work of breathing. ?Skin: No rashes, bruises or suspicious lesions. ?Neurologic: Grossly intact, no focal deficits, moving all 4 extremities. ?Psychiatric: Normal mood and affect. ? ?Laboratory Data: ? ?Lab Results  ?Component Value Date  ? CREATININE 0.64 09/20/2018  ? ?Labs reviewed in care everywhere ? ?Pertinent Imaging: ?CLINICAL INDICATION: 26 year old with eval for renal colic, r - side pan ; Abdominal/flank pain, stone suspected    ? ?COMPARISON: None available.  ? ?TECHNIQUE: A spiral CT scan was obtained without IV contrast from the lung bases to  the pubic symphysis.  Images were reconstructed in the axial plane. Coronal and sagittal reformatted images were also provided for further evaluation.  ? ?Evaluation of the solid organs and vasculature is limited in the absence of intravenous contrast.  ? ? ?FINDINGS:  ? ?LOWER CHEST: No acute abnormality within the visualized thorax.  ? ?LIVER: Normal liver contour. No focal liver lesion on non-contrast examination.  ? ?BILIARY: No intrahepatic biliary ductal dilatation.  Cholelithiasis without gallbladder wall thickening or pericholecystic free fluid.  ? ?SPLEEN: Normal in size and contour. Splenule, measuring 1.7 cm (3:38).  ? ?PANCREAS: Normal pancreatic contour without signs of inflammation or gross ductal dilatation. No peripancreatic fat stranding.  ? ?ADRENAL GLANDS: Normal appearance of the adrenal glands.  ? ?KIDNEYS/URETERS: Smooth renal contours.  Mild right hydronephrosis with 0.4 cm stone in the proximal right ureter (3:67). No additional nephrolithiasis.  ? ?BLADDER: Partially distended.  ? ?REPRODUCTIVE ORGANS: Uterus is present. No adnexal masses. Scattered pelvic phleboliths.  ? ?GI TRACT: Normal course and caliber of the stomach and duodenum. No findings of bowel obstruction or acute inflammation.  Normal appendix (6:95).  ? ?PERITONEUM/RETROPERITONEUM AND MESENTERY: No free air. No ascites. No fluid collection.  ? ?VASCULATURE: Normal caliber aorta. No significant calcified atherosclerosis. Otherwise, limited evaluation without contrast.  ? ?LYMPH NODES: No adenopathy.  ? ?BONES and SOFT TISSUES: No aggressive osseous lesions. No focal soft tissue lesions.  ? ?IMPRESSION:  ?-Obstructing 0.4 cm stone within the proximal right ureter with mild right-sided hydronephrosis.  ?-Additional chronic and incidental findings as characterized in the body of the report. ? ? ?I have personally reviewed the images and agree with radiologist interpretation.  ? ?KUB today was personally reviewed and interpreted  see HPI.  ? ? ?Assessment & Plan:   ?Right ureteral calculus ?-KUB today with a calcification over the right sacrum which does measure about 4 mm which likely represents the stone, continues to have pain and discomfort consistent with retained stone ? ?- We discussed various treatment options for urolithiasis including observation with or without medical expulsive therapy, shockwave lithotripsy (SWL), ureteroscopy and laser lithotripsy with stent placement, and percutaneous nephrolithotomy. ?  ?We discussed that management is based on stone size, location, density, patient co-morbidities, and patient preference.  ?  ?Stones <42mm in size have a >80% spontaneous passage rate. Data surrounding the use of tamsulosin for medical expulsive therapy is controversial, but meta analyses suggests it is most efficacious for distal stones between 5-22mm in size. Possible side effects include dizziness/lightheadedness, and retrograde ejaculation. ?  ?SWL has a lower stone free rate in a single procedure, but also a lower complication rate compared to ureteroscopy and avoids a stent and associated stent related symptoms. Possible complications include renal hematoma, steinstrasse, and need for additional treatment. We discussed the role of his increased skin to stone distance can lead to decreased efficacy with shockwave lithotripsy. ?  ?Ureteroscopy with laser lithotripsy and stent placement has  a higher stone free rate than SWL in a single procedure, however increased complication rate including possible infection, ureteral injury, bleeding, and stent related morbidity. Common stent related symptoms include dysuria, urgency/frequency, and flank pain. ?  ?After an extensive discussion of the risks and benefits of the above treatment options, the patient would like to proceed with ESWL  ? ?- In the interim will refill Flomax and hydrocodone in light of ongoing pain ? ?- Discussed with patient that if they expereince symptoms such  as severe pain , nausea and vomiting in the interim  advise to go to the emergency room  ? ? ?Return for ESWL  ? ?Tawni Millers as a Neurosurgeon for Kristi Scotland, MD.,have documented all relevant docu

## 2022-02-11 ENCOUNTER — Ambulatory Visit: Payer: 59 | Admitting: Urology

## 2022-02-11 ENCOUNTER — Inpatient Hospital Stay: Admit: 2022-02-11 | Payer: Self-pay

## 2022-02-11 ENCOUNTER — Encounter: Payer: Self-pay | Admitting: Urology

## 2022-02-11 ENCOUNTER — Other Ambulatory Visit: Payer: Self-pay

## 2022-02-11 ENCOUNTER — Ambulatory Visit
Admission: RE | Admit: 2022-02-11 | Discharge: 2022-02-11 | Disposition: A | Discharge status: Home / Self Care | Payer: 59 | Source: Ambulatory Visit | Attending: Urology | Admitting: Urology

## 2022-02-11 ENCOUNTER — Ambulatory Visit
Admission: RE | Admit: 2022-02-11 | Discharge: 2022-02-11 | Disposition: A | Discharge status: Home / Self Care | Payer: 59 | Attending: Urology | Admitting: Urology

## 2022-02-11 VITALS — BP 120/82 | HR 73 | Ht 61.0 in | Wt 200.0 lb

## 2022-02-11 DIAGNOSIS — Z87442 Personal history of urinary calculi: Secondary | ICD-10-CM | POA: Insufficient documentation

## 2022-02-11 DIAGNOSIS — N201 Calculus of ureter: Secondary | ICD-10-CM | POA: Diagnosis not present

## 2022-02-11 MED ORDER — HYDROCODONE-ACETAMINOPHEN 5-325 MG PO TABS: 1.0000 | ORAL_TABLET | Freq: Four times a day (QID) | ORAL | 0 refills | Status: DC | PRN

## 2022-02-11 MED ORDER — TAMSULOSIN HCL 0.4 MG PO CAPS: 0.4000 mg | ORAL_CAPSULE | Freq: Every day | ORAL | 0 refills | Status: DC

## 2022-02-11 NOTE — Patient Instructions (Signed)
Lithotripsy  Lithotripsy is a treatment that can help break up kidney stones that are too large to pass on their own. This is a nonsurgical procedure that crushes a kidney stone with shock waves. These shock waves pass through your body and focus on the kidney stone. They cause the kidney stone to break up into smaller pieces while it is still in the urinary tract. The smaller pieces of stone can pass more easily out of your body in the urine. Tell a health care provider about: Any allergies you have. All medicines you are taking, including vitamins, herbs, eye drops, creams, and over-the-counter medicines. Any problems you or family members have had with anesthetic medicines. Any blood disorders you have. Any surgeries you have had. Any medical conditions you have. Whether you are pregnant or may be pregnant. What are the risks? Generally, this is a safe procedure. However, problems may occur, including: Infection. Bleeding from the kidney. Bruising of the kidney or skin. Scarring of the kidney, which can lead to: Increased blood pressure. Poor kidney function. Return (recurrence) of kidney stones. Damage to other structures or organs, such as the liver, colon, spleen, or pancreas. Blockage (obstruction) of the tube that carries urine from the kidney to the bladder (ureter). Failure of the kidney stone to break into pieces (fragments). What happens before the procedure? Staying hydrated Follow instructions from your health care provider about hydration, which may include: Up to 2 hours before the procedure - you may continue to drink clear liquids, such as water, clear fruit juice, black coffee, and plain tea. Eating and drinking restrictions Follow instructions from your health care provider about eating and drinking, which may include: 8 hours before the procedure - stop eating heavy meals or foods, such as meat, fried foods, or fatty foods. 6 hours before the procedure - stop eating  light meals or foods, such as toast or cereal. 6 hours before the procedure - stop drinking milk or drinks that contain milk. 2 hours before the procedure - stop drinking clear liquids. Medicines Ask your health care provider about: Changing or stopping your regular medicines. This is especially important if you are taking diabetes medicines or blood thinners. Taking medicines such as aspirin and ibuprofen. These medicines can thin your blood. Do not take these medicines unless your health care provider tells you to take them. Taking over-the-counter medicines, vitamins, herbs, and supplements. Tests You may have tests, such as: Blood tests. Urine tests. Imaging tests, such as a CT scan. General instructions Plan to have someone take you home from the hospital or clinic. If you will be going home right after the procedure, plan to have someone with you for 24 hours. Ask your health care provider what steps will be taken to help prevent infection. These may include washing skin with a germ-killing soap. What happens during the procedure?  An IV will be inserted into one of your veins. You will be given one or more of the following: A medicine to help you relax (sedative). A medicine to make you fall asleep (general anesthetic). A water-filled cushion may be placed behind your kidney or on your abdomen. In some cases, you may be placed in a tub of lukewarm water. Your body will be positioned in a way that makes it easy to target the kidney stone. An X-ray or ultrasound exam will be done to locate your stone. Shock waves will be aimed at the stone. If you are awake, you may feel a tapping sensation   as the shock waves pass through your body. A flexible tube with holes in it (stent) may be placed in the ureter. This will help keep urine flowing from the kidney if the fragments of the stone have been blocking the ureter. The procedure may vary among health care providers and hospitals. What  happens after the procedure? You may have an X-ray to see whether the procedure was able to break up the kidney stone and how much of the stone has passed. If large stone fragments remain after treatment, you may need to have a second procedure at a later time. Your blood pressure, heart rate, breathing rate, and blood oxygen level will be monitored until you leave the hospital or clinic. You may be given antibiotics or pain medicine as needed. If a stent was placed in your ureter during surgery, it may stay in place for a few weeks. You may need to strain your urine to collect pieces of the kidney stone for testing. You will need to drink plenty of water. If you were given a sedative during the procedure, it can affect you for several hours. Do not drive or operate machinery until your health care provider says that it is safe. Summary Lithotripsy is a treatment that can help break up kidney stones that are too large to pass on their own. Lithotripsy is a nonsurgical procedure that crushes a kidney stone with shock waves. Generally, this is a safe procedure. However, problems may occur, including damage to the kidney or other organs, infection, or obstruction of the tube that carries urine from the kidney to the bladder (ureter). You may have a stent placed in your ureter to help drain your urine. This stent may stay in place for a few weeks. After the procedure, you will need to drink plenty of water. You may be asked to strain your urine to collect pieces of the kidney stone for testing. This information is not intended to replace advice given to you by your health care provider. Make sure you discuss any questions you have with your health care provider. Document Revised: 08/30/2021 Document Reviewed: 06/07/2021 Elsevier Patient Education  2023 Elsevier Inc.  

## 2022-02-14 ENCOUNTER — Other Ambulatory Visit: Payer: Self-pay

## 2022-02-14 ENCOUNTER — Encounter: Payer: Self-pay | Admitting: Urology

## 2022-02-14 DIAGNOSIS — N200 Calculus of kidney: Secondary | ICD-10-CM

## 2022-02-14 NOTE — Progress Notes (Signed)
ESWL ORDER FORM ? ?Expected date of procedure: 02/17/2022 ? ?Surgeon: Nickolas Madrid, MD ? ?Post op standing: 2-4wk follow up w/KUB prior ? ?Anticoagulation/Aspirin/NSAID standing order: Hold all 72 hours prior ? ?Anesthesia standing order: MAC ? ?VTE standing: SCD's ? ?Dx: Right Nephrolihtiasis ? ?Procedure: right Extracorporeal shock wave lithotripsy ? ?CPT : [phone] ? ?Standing Order Set:  ? ?*NPO after mn, KUB ? ?*NS 152m/hr, Keflex 5070mPO, Benadryl 2577mO, Valium 31m36m, Zofran 4mg 87m? ? ? ?Medications if other than standing orders:   NONE ? ? ?

## 2022-02-17 ENCOUNTER — Other Ambulatory Visit: Payer: Self-pay

## 2022-02-17 ENCOUNTER — Encounter: Payer: Self-pay | Admitting: Registered Nurse

## 2022-02-17 ENCOUNTER — Encounter: Payer: Self-pay | Admitting: Urology

## 2022-02-17 ENCOUNTER — Ambulatory Visit: Payer: 59

## 2022-02-17 ENCOUNTER — Encounter
Admission: RE | Disposition: A | Discharge status: Home / Self Care | Payer: Self-pay | Source: Home / Self Care | Attending: Urology

## 2022-02-17 ENCOUNTER — Ambulatory Visit
Admission: RE | Admit: 2022-02-17 | Discharge: 2022-02-17 | Disposition: A | Discharge status: Home / Self Care | Payer: 59 | Attending: Urology | Admitting: Urology

## 2022-02-17 DIAGNOSIS — N200 Calculus of kidney: Secondary | ICD-10-CM

## 2022-02-17 DIAGNOSIS — N201 Calculus of ureter: Secondary | ICD-10-CM | POA: Insufficient documentation

## 2022-02-17 HISTORY — PX: EXTRACORPOREAL SHOCK WAVE LITHOTRIPSY: SHX1557

## 2022-02-17 LAB — POCT PREGNANCY, URINE: Preg Test, Ur: NEGATIVE

## 2022-02-17 SURGERY — LITHOTRIPSY, ESWL
Anesthesia: Moderate Sedation | Laterality: Right

## 2022-02-17 MED ORDER — CEPHALEXIN 500 MG PO CAPS: 500.0000 mg | ORAL_CAPSULE | Freq: Once | ORAL | Status: AC

## 2022-02-17 MED ORDER — DIAZEPAM 5 MG PO TABS: 10.0000 mg | ORAL_TABLET | ORAL | Status: AC

## 2022-02-17 MED ORDER — CEPHALEXIN 500 MG PO CAPS
ORAL_CAPSULE | ORAL | Status: AC
  Administered 2022-02-17: 500 mg via ORAL
  Filled 2022-02-17: qty 1

## 2022-02-17 MED ORDER — DIPHENHYDRAMINE HCL 25 MG PO CAPS: 25.0000 mg | ORAL_CAPSULE | ORAL | Status: AC

## 2022-02-17 MED ORDER — DIPHENHYDRAMINE HCL 25 MG PO CAPS
ORAL_CAPSULE | ORAL | Status: AC
  Administered 2022-02-17: 25 mg via ORAL
  Filled 2022-02-17: qty 1

## 2022-02-17 MED ORDER — TAMSULOSIN HCL 0.4 MG PO CAPS: 0.4000 mg | ORAL_CAPSULE | Freq: Every day | ORAL | 1 refills | Status: DC

## 2022-02-17 MED ORDER — ONDANSETRON HCL 4 MG/2ML IJ SOLN
INTRAMUSCULAR | Status: AC
  Administered 2022-02-17: 4 mg via INTRAVENOUS
  Filled 2022-02-17: qty 2

## 2022-02-17 MED ORDER — ONDANSETRON HCL 4 MG/2ML IJ SOLN: 4.0000 mg | Freq: Once | INTRAMUSCULAR | Status: AC

## 2022-02-17 MED ORDER — DIAZEPAM 5 MG PO TABS
ORAL_TABLET | ORAL | Status: AC
  Administered 2022-02-17: 10 mg via ORAL
  Filled 2022-02-17: qty 2

## 2022-02-17 MED ORDER — SODIUM CHLORIDE 0.9 % IV SOLN: INTRAVENOUS | Status: DC

## 2022-02-17 NOTE — Interval H&P Note (Signed)
UROLOGY H&P UPDATE ? ?Agree with prior H&P dated 02/11/22 by Dr Erlene Quan. 15mm right distal ureteral stone. ? ?Cardiac: RRR ?Lungs: CTA bilaterally ? ?Laterality: right ?Procedure: right SWL ? ? ?Informed consent obtained, we specifically discussed the risks of bleeding/hematoma, infection, post-operative pain/obstructive fragments/steinstrasse, need for additional procedures. ? ?Billey Co, MD ?02/17/2022 ? ? ?

## 2022-02-17 NOTE — Brief Op Note (Signed)
02/17/2022 ? ?10:16 AM ? ?PATIENT:  Kristi Rhodes  26 y.o. female ? ?PRE-OPERATIVE DIAGNOSIS:  Right 79mm distal ureteral stone ? ?POST-OPERATIVE DIAGNOSIS:  Same ? ?PROCEDURE:  Procedure(s): ?EXTRACORPOREAL SHOCK WAVE LITHOTRIPSY (ESWL) (Right) ? ?SURGEON:  Surgeon(s) and Role: ?   * Sunil Hue, Laurette Schimke, MD - Primary ? ?ANESTHESIA: Conscious Sedation ? ?EBL:  None ? ?Drains: None ? ?Specimen: None ? ?Findings:  ?Tolerated SWL well, stone smudged nicely ? ?DISPO: ?Flomax, pain meds PRN, RTC 2 weeks KUB ? ?Legrand Rams, MD ?02/17/2022 ? ? ? ? ? ?

## 2022-02-17 NOTE — Discharge Instructions (Signed)
AMBULATORY SURGERY  ?DISCHARGE INSTRUCTIONS ? ? ?The drugs that you were given will stay in your system until tomorrow so for the next 24 hours you should not: ? ?Drive an automobile ?Make any legal decisions ?Drink any alcoholic beverage ? ? ?You may resume regular meals tomorrow.  Today it is better to start with liquids and gradually work up to solid foods. ? ?You may eat anything you prefer, but it is better to start with liquids, then soup and crackers, and gradually work up to solid foods. ? ? ?Please notify your doctor immediately if you have any unusual bleeding, trouble breathing, redness and pain at the surgery site, drainage, fever, or pain not relieved by medication. ? ? ? ?Additional Instructions: ? ? ? ?Please contact your physician with any problems or Same Day Surgery at [phone], Monday through Friday 6 am to 4 pm, or [facility] [place] [facility] number at [phone].  ?

## 2022-02-21 ENCOUNTER — Other Ambulatory Visit: Payer: Self-pay

## 2022-02-21 ENCOUNTER — Other Ambulatory Visit: Payer: Self-pay | Admitting: Urology

## 2022-02-21 DIAGNOSIS — N200 Calculus of kidney: Secondary | ICD-10-CM

## 2022-02-21 MED ORDER — ONDANSETRON HCL 4 MG PO TABS: 4.0000 mg | ORAL_TABLET | ORAL | 0 refills | Status: AC | PRN

## 2022-02-21 NOTE — Progress Notes (Signed)
See my chart message

## 2022-03-07 ENCOUNTER — Ambulatory Visit: Payer: 59 | Admitting: Urology

## 2022-03-07 ENCOUNTER — Ambulatory Visit
Admission: RE | Admit: 2022-03-07 | Discharge: 2022-03-07 | Disposition: A | Discharge status: Home / Self Care | Payer: 59 | Source: Ambulatory Visit | Attending: Urology | Admitting: Urology

## 2022-03-07 ENCOUNTER — Ambulatory Visit
Admission: RE | Admit: 2022-03-07 | Discharge: 2022-03-07 | Disposition: A | Discharge status: Home / Self Care | Payer: 59 | Attending: Urology | Admitting: Urology

## 2022-03-07 ENCOUNTER — Encounter: Payer: Self-pay | Admitting: Urology

## 2022-03-07 ENCOUNTER — Other Ambulatory Visit
Admission: RE | Admit: 2022-03-07 | Discharge: 2022-03-07 | Disposition: A | Discharge status: Home / Self Care | Payer: 59 | Source: Home / Self Care | Attending: Urology | Admitting: Urology

## 2022-03-07 ENCOUNTER — Other Ambulatory Visit: Payer: Self-pay | Admitting: *Deleted

## 2022-03-07 VITALS — BP 118/77 | HR 78 | Ht 61.0 in | Wt 212.0 lb

## 2022-03-07 DIAGNOSIS — N200 Calculus of kidney: Secondary | ICD-10-CM

## 2022-03-07 LAB — URINALYSIS, COMPLETE (UACMP) WITH MICROSCOPIC
Bilirubin Urine: NEGATIVE
Glucose, UA: NEGATIVE mg/dL
Hgb urine dipstick: NEGATIVE
Ketones, ur: NEGATIVE mg/dL
Nitrite: NEGATIVE
Protein, ur: NEGATIVE mg/dL
RBC / HPF: NONE SEEN RBC/hpf (ref 0–5)
Specific Gravity, Urine: 1.02 (ref 1.005–1.030)
pH: 7.5 (ref 5.0–8.0)

## 2022-03-07 NOTE — Progress Notes (Signed)
03/08/22 4:01 PM   Kristi Rhodes 12/23/1995 [id number]  Referring provider:  No referring provider defined for this encounter.  Chief Complaint  Patient presents with   Follow-up   Nephrolithiasis    Urological history  Right ureteral calculus  - CT abdomen and pelvis on 02/04/2022 visualized an obstructing 0.4 cm stone within the proximal right ureter with mild right-sided hydronephrosis and additional chronic and incidental findings - KUB on 02/11/2022 visualized no acute abnormality. It was personally reviewed by Dr Erlene Quan who visualized a possible stone in the right bony pelvis area just over sacrum measuring 4 mm.  - S/p ESWL with Dr Diamantina Providence on 02/17/2022   HPI: Kristi Rhodes is a 26 y.o.female who presents for post-op ESWL follow-up with KUB prior.   KUB today was personally reviewed and showed resolution of right distal ureter on todays KUB.    She was not able to capture fragment due to her menstrual cycle.   Patient denies any modifying or aggravating factors.  Patient denies any gross hematuria, dysuria or suprapubic/flank pain.  Patient denies any fevers, chills, nausea or vomiting.    PMH: No past medical history on file.  Surgical History: Past Surgical History:  Procedure Laterality Date   EXTRACORPOREAL SHOCK WAVE LITHOTRIPSY Right 02/17/2022   Procedure: EXTRACORPOREAL SHOCK WAVE LITHOTRIPSY (ESWL);  Surgeon: Billey Co, MD;  Location: [facility] ORS;  Service: Urology;  Laterality: Right;    Home Medications:  Allergies as of 03/07/2022   No Known Allergies      Medication List        Accurate as of Mar 07, 2022 11:59 PM. If you have any questions, ask your nurse or doctor.          STOP taking these medications    cephALEXin 500 MG capsule Commonly known as: KEFLEX Stopped by: Justina Bertini, PA-C   cetirizine 10 MG tablet Commonly known as: ZyrTEC Allergy Stopped by: Evadean Sproule, PA-C   fluticasone 50 MCG/ACT nasal spray Commonly  known as: FLONASE Stopped by: Zara Council, PA-C   HYDROcodone-acetaminophen 5-325 MG tablet Commonly known as: NORCO/VICODIN Stopped by: Ardean Simonich, PA-C   ketorolac 10 MG tablet Commonly known as: TORADOL Stopped by: Vickie Ponds, PA-C   predniSONE 20 MG tablet Commonly known as: DELTASONE Stopped by: Hazell Siwik, PA-C   sertraline 100 MG tablet Commonly known as: Zoloft Stopped by: Zara Council, PA-C   tamsulosin 0.4 MG Caps capsule Commonly known as: FLOMAX Stopped by: Harol Shabazz, PA-C   traZODone 50 MG tablet Commonly known as: DESYREL Stopped by: Zara Council, PA-C       TAKE these medications    ondansetron 4 MG tablet Commonly known as: Zofran Take 1 tablet (4 mg total) by mouth every 4 (four) hours as needed for nausea or vomiting.        Allergies:  No Known Allergies  Family History: Family History  Problem Relation Age of Onset   Diabetes Father    Thyroid disease Mother     Social History:  reports that she has never smoked. She has never been exposed to tobacco smoke. She has never used smokeless tobacco. She reports current alcohol use. She reports that she does not use drugs.   Physical Exam: BP 118/77   Pulse 78   Ht 5' 1"  (1.549 m)   Wt 212 lb (96.2 kg)   LMP 02/18/2022 Comment: denies preg, signed waiver  BMI 40.06 kg/m   Constitutional:  Alert and oriented, No acute distress.  HEENT: [place] AT, moist mucus membranes.  Trachea midline, no masses. Cardiovascular: No clubbing, cyanosis, or edema. Respiratory: Normal respiratory effort, no increased work of breathing. Skin: No rashes, bruises or suspicious lesions. Neurologic: Grossly intact, no focal deficits, moving all 4 extremities. Psychiatric: Normal mood and affect.   Laboratory Data:\ Lab Results  Component Value Date   CREATININE 0.64 09/20/2018   Sodium 135 - 145 mmol/L 140   Potassium 3.4 - 4.8 mmol/L 3.7   Chloride 98 - 107 mmol/L 108 High     CO2 20.0 - 31.0 mmol/L 22.9   Anion Gap 5 - 14 mmol/L 9   BUN 9 - 23 mg/dL 9   Creatinine 0.60 - 0.80 mg/dL 0.76   BUN/Creatinine Ratio  12   eGFR CKD-EPI (2021) Female >=60 mL/min/1.90m >90   Comment: eGFR calculated with CKD-EPI 2021 equation in accordance with [employer] [employer] Ne[employer] Task Force recommendations.  Glucose 70 - 179 mg/dL 144   Calcium 8.7 - 10.4 mg/dL 9.7   Albumin 3.4 - 5.0 g/dL 4.0   Total Protein 5.7 - 8.2 g/dL 7.8   Total Bilirubin 0.3 - 1.2 mg/dL 0.6   AST <=34 U/L 15   ALT 10 - 49 U/L 17   Alkaline Phosphatase 46 - 116 U/L 103   [facility]  Specimen Collected: 02/04/22 10:02 Last Resulted: 02/04/22 10:31  Received From: [facility] Result Received: 02/09/22 14:16   WBC 3.6 - 11.2 10*9/L 11.7 High    RBC 3.95 - 5.13 10*12/L 4.52   HGB 11.3 - 14.9 g/dL 12.0   HCT 34.0 - 44.0 % 36.3   MCV 77.6 - 95.7 fL 80.4   MCH 25.9 - 32.4 pg 26.6   MCHC 32.0 - 36.0 g/dL 33.1   RDW 12.2 - 15.2 % 14.2   MPV 6.8 - 10.7 fL 7.2   Platelet 150 - 450 10*9/L 295   nRBC <=4 /100 WBCs 0   Neutrophils % % 83.7   Lymphocytes % % 11.8   Monocytes % % 3.7   Eosinophils % % 0.3   Basophils % % 0.5   Absolute Neutrophils 1.8 - 7.8 10*9/L 9.8 High    Absolute Lymphocytes 1.1 - 3.6 10*9/L 1.4   Absolute Monocytes 0.3 - 0.8 10*9/L 0.4   Absolute Eosinophils 0.0 - 0.5 10*9/L 0.0   Absolute Basophils 0.0 - 0.1 10*9/L 0.1   [facility]  Specimen Collected: 02/04/22 10:02 Last Resulted: 02/04/22 10:07  Received From: [facility] Result Received: 02/09/22 14:16   I have reviewed the labs.   Pertinent Imaging: KUB was personally reviewed and interpreted see HPI.   Assessment & Plan:    1. Right ureteral calculus  - S/p ESWL  -KUB today was reassuring;  Resolved  - Return in 6 months   Return in about 6 months (around 09/07/2022) for KUB.  [place] [place] [phone] I, KKirke ShaggyLittlejohn,acting as a scribe for [facility] PA-C.,have documented all relevant documentation on the behalf of Mattheu Brodersen, PA-C,as directed by  [facility] PA-C while in the presence of [place] PA-C.

## 2022-03-15 ENCOUNTER — Encounter: Payer: Self-pay | Admitting: Urology

## 2022-09-07 ENCOUNTER — Other Ambulatory Visit: Payer: Self-pay

## 2022-09-07 ENCOUNTER — Other Ambulatory Visit: Payer: Self-pay | Admitting: Urology

## 2022-09-07 DIAGNOSIS — Z87442 Personal history of urinary calculi: Secondary | ICD-10-CM

## 2022-09-07 NOTE — Progress Notes (Deleted)
09/07/22 9:32 AM   Eveline Keto [birth year] [id number]  Referring provider:  No referring provider defined for this encounter.  Urological history  Right ureteral calculus  - CT abdomen and pelvis on 02/04/2022 visualized an obstructing 0.4 cm stone within the proximal right ureter with mild right-sided hydronephrosis and additional chronic and incidental findings - KUB on 02/11/2022 visualized no acute abnormality. It was personally reviewed by Dr Apolinar Junes who visualized a possible stone in the right bony pelvis area just over sacrum measuring 4 mm.  -ESWL with Dr Richardo Hanks on 02/17/2022  No chief complaint on file.   HPI: Genevie Elman is a 26 y.o.female who presents for 6 months follow up.   KUB ***   PMH: No past medical history on file.  Surgical History: Past Surgical History:  Procedure Laterality Date   EXTRACORPOREAL SHOCK WAVE LITHOTRIPSY Right 02/17/2022   Procedure: EXTRACORPOREAL SHOCK WAVE LITHOTRIPSY (ESWL);  Surgeon: Sondra Come, MD;  Location: [facility] ORS;  Service: Urology;  Laterality: Right;    Home Medications:  Allergies as of 09/12/2022   No Known Allergies      Medication List        Accurate as of September 07, 2022  9:32 AM. If you have any questions, ask your nurse or doctor.          ondansetron 4 MG tablet Commonly known as: Zofran Take 1 tablet (4 mg total) by mouth every 4 (four) hours as needed for nausea or vomiting.        Allergies:  No Known Allergies  Family History: Family History  Problem Relation Age of Onset   Diabetes Father    Thyroid disease Mother     Social History:  reports that she has never smoked. She has never been exposed to tobacco smoke. She has never used smokeless tobacco. She reports current alcohol use. She reports that she does not use drugs.   Physical Exam: There were no vitals taken for this visit.  Constitutional:  Well nourished. Alert and oriented, No acute distress. HEENT: [place] AT, moist  mucus membranes.  Trachea midline Cardiovascular: No clubbing, cyanosis, or edema. Respiratory: Normal respiratory effort, no increased work of breathing. Neurologic: Grossly intact, no focal deficits, moving all 4 extremities. Psychiatric: Normal mood and affect.    Laboratory Data: N/A   Pertinent Imaging: KUB was personally reviewed and interpreted see HPI.   Assessment & Plan:    1.  Nephrolithiasis *** No follow-ups on file.  Cloretta Ned  [facility] [address], [address] [place], [place] [address] [phone]

## 2022-09-12 ENCOUNTER — Ambulatory Visit: Payer: 59 | Admitting: Urology

## 2022-09-12 DIAGNOSIS — N200 Calculus of kidney: Secondary | ICD-10-CM

## 2022-10-28 ENCOUNTER — Ambulatory Visit
Admission: EM | Admit: 2022-10-28 | Discharge: 2022-10-28 | Disposition: A | Discharge status: Home / Self Care | Payer: 59 | Attending: Family Medicine | Admitting: Family Medicine

## 2022-10-28 ENCOUNTER — Ambulatory Visit: Payer: Self-pay

## 2022-10-28 ENCOUNTER — Encounter: Payer: Self-pay | Admitting: Emergency Medicine

## 2022-10-28 DIAGNOSIS — R198 Other specified symptoms and signs involving the digestive system and abdomen: Secondary | ICD-10-CM

## 2022-10-28 MED ORDER — CEPHALEXIN 500 MG PO CAPS: 500.0000 mg | ORAL_CAPSULE | Freq: Two times a day (BID) | ORAL | 0 refills | Status: AC

## 2022-10-28 NOTE — Discharge Instructions (Signed)
You may apply warm compresses, Neosporin to the area and take the full course of antibiotics that has been given.  In order to get established with a primary care provider, you may go to the [facility] health website and find the find a provider tab and schedule this appointment online.

## 2022-10-28 NOTE — ED Provider Notes (Signed)
[facility]    CSN: [id number] Arrival date & time: 10/28/22  [id number]      History   Chief Complaint No chief complaint on file.   HPI Kristi Rhodes is a 27 y.o. female.   Patient presenting today with 2 to 3-day history of umbilical pain, bloody clear drainage from this area and a pinching sensation with sitting up.  States she has a history of recurrent yeast infections to this area but has never had drainage such as this.  Has been trying the remainder of her nystatin cream that she was given for previous yeast infection with no relief.  Denies fever, chills, nausea, vomiting, diarrhea, constipation.  Does not currently have a primary care provider.    History reviewed. No pertinent past medical history.  There are no problems to display for this patient.   Past Surgical History:  Procedure Laterality Date   EXTRACORPOREAL SHOCK WAVE LITHOTRIPSY Right 02/17/2022   Procedure: EXTRACORPOREAL SHOCK WAVE LITHOTRIPSY (ESWL);  Surgeon: Billey Co, MD;  Location: [facility] ORS;  Service: Urology;  Laterality: Right;    OB History     Gravida  0   Para  0   Term  0   Preterm  0   AB  0   Living  0      SAB  0   IAB  0   Ectopic  0   Multiple  0   Live Births  0            Home Medications    Prior to Admission medications   Medication Sig Start Date End Date Taking? Authorizing Provider  cephALEXin (KEFLEX) 500 MG capsule Take 1 capsule (500 mg total) by mouth 2 (two) times daily. 10/28/22  Yes Volney American, PA-C  ondansetron (ZOFRAN) 4 MG tablet Take 1 tablet (4 mg total) by mouth every 4 (four) hours as needed for nausea or vomiting. 02/21/22   Hollice Espy, MD    Family History Family History  Problem Relation Age of Onset   Diabetes Father    Thyroid disease Mother     Social History Social History   Tobacco Use   Smoking status: Never    Passive exposure: Never   Smokeless tobacco: Never  Vaping Use   Vaping Use:  Never used  Substance Use Topics   Alcohol use: Yes   Drug use: Never     Allergies   Patient has no known allergies.   Review of Systems Review of Systems Per HPI  Physical Exam Triage Vital Signs ED Triage Vitals  Enc Vitals Group     BP 10/28/22 0906 123/77     Pulse Rate 10/28/22 0906 76     Resp 10/28/22 0906 18     Temp 10/28/22 0906 98 F (36.7 C)     Temp Source 10/28/22 0906 Oral     SpO2 10/28/22 0906 99 %     Weight --      Height --      Head Circumference --      Peak Flow --      Pain Score 10/28/22 0907 0     Pain Loc --      Pain Edu? --      Excl. in [place]? --    No data found.  Updated Vital Signs BP 123/77 (BP Location: Right Arm)   Pulse 76   Temp 98 F (36.7 C) (Oral)   Resp 18   LMP  10/20/2022 (Exact Date)   SpO2 99%   Visual Acuity Right Eye Distance:   Left Eye Distance:   Bilateral Distance:    Right Eye Near:   Left Eye Near:    Bilateral Near:     Physical Exam Vitals and nursing note reviewed.  Constitutional:      Appearance: Normal appearance. She is not ill-appearing.  HENT:     Head: Atraumatic.  Eyes:     Extraocular Movements: Extraocular movements intact.     Conjunctiva/sclera: Conjunctivae normal.  Cardiovascular:     Rate and Rhythm: Normal rate and regular rhythm.     Heart sounds: Normal heart sounds.  Pulmonary:     Effort: Pulmonary effort is normal.     Breath sounds: Normal breath sounds.  Musculoskeletal:        General: Normal range of motion.     Cervical back: Normal range of motion and neck supple.  Skin:    General: Skin is warm.     Comments: Deep umbilical region mildly erythematous, thick bloody drainage present to the area, mildly tender to palpation but no fluctuance, induration, surrounding erythema to the site  Neurological:     Mental Status: She is alert and oriented to person, place, and time.  Psychiatric:        Mood and Affect: Mood normal.        Thought Content: Thought  content normal.        Judgment: Judgment normal.      UC Treatments / Results  Labs (all labs ordered are listed, but only abnormal results are displayed) Labs Reviewed - No data to display  EKG   Radiology No results found.  Procedures Procedures (including critical care time)  Medications Ordered in UC Medications - No data to display  Initial Impression / Assessment and Plan / UC Course  I have reviewed the triage vital signs and the nursing notes.  Pertinent labs & imaging results that were available during my care of the patient were reviewed by me and considered in my medical decision making (see chart for details).     Consistent with umbilical infection, treat with Keflex, Neosporin, warm compresses and establish with a primary care provider for follow-up.  Return for worsening symptoms.  Final Clinical Impressions(s) / UC Diagnoses   Final diagnoses:  Umbilical bleeding     Discharge Instructions      You may apply warm compresses, Neosporin to the area and take the full course of antibiotics that has been given.  In order to get established with a primary care provider, you may go to the [facility] health website and find the find a provider tab and schedule this appointment online.    ED Prescriptions     Medication Sig Dispense Auth. Provider   cephALEXin (KEFLEX) 500 MG capsule Take 1 capsule (500 mg total) by mouth 2 (two) times daily. 14 capsule Volney American, [place]      PDMP not reviewed this encounter.   Merrie Roof Z[place], [place] 10/28/22 [phone]

## 2022-10-28 NOTE — ED Triage Notes (Signed)
Has had a history of a belly button infection.  Noticed a ball of skin inside belly button on Wednesday.  States she noticed blood drainage yesterday.

## 2023-01-01 ENCOUNTER — Encounter: Payer: Self-pay | Admitting: Emergency Medicine

## 2023-01-01 ENCOUNTER — Ambulatory Visit
Admission: EM | Admit: 2023-01-01 | Discharge: 2023-01-01 | Disposition: A | Discharge status: Home / Self Care | Payer: 59 | Attending: Nurse Practitioner | Admitting: Nurse Practitioner

## 2023-01-01 DIAGNOSIS — M546 Pain in thoracic spine: Secondary | ICD-10-CM | POA: Diagnosis present

## 2023-01-01 DIAGNOSIS — R112 Nausea with vomiting, unspecified: Secondary | ICD-10-CM | POA: Diagnosis not present

## 2023-01-01 HISTORY — DX: Calculus of kidney: N20.0

## 2023-01-01 LAB — POCT URINALYSIS DIP (MANUAL ENTRY)
Bilirubin, UA: NEGATIVE
Blood, UA: NEGATIVE
Glucose, UA: NEGATIVE mg/dL
Ketones, POC UA: NEGATIVE mg/dL
Leukocytes, UA: NEGATIVE
Nitrite, UA: NEGATIVE
Protein Ur, POC: NEGATIVE mg/dL
Spec Grav, UA: 1.015 (ref 1.010–1.025)
Urobilinogen, UA: 0.2 E.U./dL
pH, UA: 8.5 — AB (ref 5.0–8.0)

## 2023-01-01 MED ORDER — KETOROLAC TROMETHAMINE 30 MG/ML IJ SOLN
30.0000 mg | Freq: Once | INTRAMUSCULAR | Status: AC
  Administered 2023-01-01: 30 mg via INTRAMUSCULAR

## 2023-01-01 MED ORDER — ONDANSETRON 4 MG PO TBDP: 4.0000 mg | ORAL_TABLET | Freq: Three times a day (TID) | ORAL | 0 refills | Status: AC | PRN

## 2023-01-01 MED ORDER — IBUPROFEN 800 MG PO TABS: 800.0000 mg | ORAL_TABLET | Freq: Three times a day (TID) | ORAL | 0 refills | Status: AC

## 2023-01-01 NOTE — ED Provider Notes (Signed)
[facility]    CSN: [id number] Arrival date & time: 01/01/23  0803      History   Chief Complaint No chief complaint on file.   HPI Kristi Rhodes is a 27 y.o. female.   The history is provided by the patient.   The patient presents with complaints of mid back pain that started last evening.  Patient states pain is located in the middle of her back radiating to the left side.  She also states that she had left-sided abdominal pain.  Patient states that the pain was so severe that she became nauseated and vomited.  Reports that she vomited approximately 4 times since her symptoms started.  She states that her pain at this time is 7/10.  She denies anything that makes the pain worse or better at this time.  She denies fever, chills, chest pain, diarrhea, constipation, or urinary symptoms.  Patient states that she does have a history of kidney stones, with her last occurrence approximately last year.  Patient's last menstrual cycle was on 12/19/2022.  She states that she did take naproxen at home which did not help her symptoms.  Past Medical History:  Diagnosis Date   Kidney stones     There are no problems to display for this patient.   Past Surgical History:  Procedure Laterality Date   EXTRACORPOREAL SHOCK WAVE LITHOTRIPSY Right 02/17/2022   Procedure: EXTRACORPOREAL SHOCK WAVE LITHOTRIPSY (ESWL);  Surgeon: Billey Co, MD;  Location: [facility];  Service: Urology;  Laterality: Right;    OB History     Gravida  0   Para  0   Term  0   Preterm  0   AB  0   Living  0      SAB  0   IAB  0   Ectopic  0   Multiple  0   Live Births  0            Home Medications    Prior to Admission medications   Medication Sig Start Date End Date Taking? Authorizing Provider  ibuprofen (ADVIL) 800 MG tablet Take 1 tablet (800 mg total) by mouth 3 (three) times daily. 01/01/23  Yes Mellie Buccellato-Warren, Alda Lea, NP  ondansetron (ZOFRAN-ODT) 4 MG disintegrating  tablet Take 1 tablet (4 mg total) by mouth every 8 (eight) hours as needed. 01/01/23  Yes Deangelo Berns-Warren, Alda Lea, NP  cephALEXin (KEFLEX) 500 MG capsule Take 1 capsule (500 mg total) by mouth 2 (two) times daily. 10/28/22   Volney American, PA-C  ondansetron (ZOFRAN) 4 MG tablet Take 1 tablet (4 mg total) by mouth every 4 (four) hours as needed for nausea or vomiting. 02/21/22   Hollice Espy, MD    Family History Family History  Problem Relation Age of Onset   Diabetes Father    Thyroid disease Mother     Social History Social History   Tobacco Use   Smoking status: Never    Passive exposure: Never   Smokeless tobacco: Never  Vaping Use   Vaping Use: Never used  Substance Use Topics   Alcohol use: Yes   Drug use: Never     Allergies   Patient has no known allergies.   Review of Systems Review of Systems Per HPI  Physical Exam Triage Vital Signs ED Triage Vitals  Enc Vitals Group     BP 01/01/23 0809 129/82     Pulse Rate 01/01/23 0809 82     Resp  01/01/23 0809 18     Temp 01/01/23 0809 97.8 F (36.6 C)     Temp Source 01/01/23 0809 Oral     SpO2 01/01/23 0809 97 %     Weight --      Height --      Head Circumference --      Peak Flow --      Pain Score 01/01/23 0810 7     Pain Loc --      Pain Edu? --      Excl. in [place]? --    No data found.  Updated Vital Signs BP 129/82 (BP Location: Right Arm)   Pulse 82   Temp 97.8 F (36.6 C) (Oral)   Resp 18   LMP 12/19/2022 (Approximate)   SpO2 97%   Visual Acuity Right Eye Distance:   Left Eye Distance:   Bilateral Distance:    Right Eye Near:   Left Eye Near:    Bilateral Near:     Physical Exam Vitals and nursing note reviewed.  Constitutional:      General: She is not in acute distress.    Appearance: Normal appearance. She is well-developed.  HENT:     Head: Normocephalic.  Eyes:     Extraocular Movements: Extraocular movements intact.     Pupils: Pupils are equal, round, and  reactive to light.  Cardiovascular:     Rate and Rhythm: Normal rate and regular rhythm.     Pulses: Normal pulses.     Heart sounds: Normal heart sounds.  Pulmonary:     Effort: Pulmonary effort is normal.     Breath sounds: Normal breath sounds.  Abdominal:     General: Bowel sounds are normal. There is no distension.     Palpations: Abdomen is soft.     Tenderness: There is no abdominal tenderness. There is no right CVA tenderness, left CVA tenderness, guarding or rebound.  Genitourinary:    Vagina: Normal. No vaginal discharge.  Musculoskeletal:     Cervical back: Normal range of motion.  Skin:    General: Skin is warm and dry.     Findings: No erythema or rash.  Neurological:     General: No focal deficit present.     Mental Status: She is alert and oriented to person, place, and time.     Cranial Nerves: No cranial nerve deficit.  Psychiatric:        Mood and Affect: Mood normal.        Behavior: Behavior normal.      UC Treatments / Results  Labs (all labs ordered are listed, but only abnormal results are displayed) Labs Reviewed  POCT URINALYSIS DIP (MANUAL ENTRY) - Abnormal; Notable for the following components:      Result Value   pH, UA 8.5 (*)    All other components within normal limits    EKG   Radiology No results found.  Procedures Procedures (including critical care time)  Medications Ordered in UC Medications  ketorolac (TORADOL) 30 MG/ML injection 30 mg (30 mg Intramuscular Given 01/01/23 0845)    Initial Impression / Assessment and Plan / UC Course  I have reviewed the triage vital signs and the nursing notes.  Pertinent labs & imaging results that were available during my care of the patient were reviewed by me and considered in my medical decision making (see chart for details).  The patient is well-appearing, she is in no acute distress, vital signs are stable.  Urinalysis  does not indicate suspicion for urinary tract infection or  kidney stone at this time.  Advised patient that because she has no tenderness, or obvious symptoms, it is difficult to ascertain the cause of her back pain.  Was given Toradol 30 mg IM in the clinic to help with her back pain.  Symptomatic treatment will be provided to help with her nausea with Zofran 4 mg, and ibuprofen 800 mg for back pain.  Supportive care recommendations were discussed with the patient to include increasing fluids, allowing for plenty of rest, and use of ice or heat as needed.  Patient was advised to follow-up if symptoms fail to improve.  Patient is in agreement with this plan of care and verbalizes understanding.  All questions were answered.  Patient stable for discharge.   Final Clinical Impressions(s) / UC Diagnoses   Final diagnoses:  Midline thoracic back pain, unspecified chronicity  Nausea and vomiting, unspecified vomiting type     Discharge Instructions      The urinalysis did not suggest a urinary tract infection or kidney stones.  A urine culture has been ordered.  You will be contacted if the culture results are positive. Take medication as prescribed. Increase fluids and allow for plenty of rest. May apply ice or heat as needed.  Apply ice for pain or swelling, heat for spasm or stiffness.  Apply for 20 minutes, remove 1 hour, then repeat is much as possible. Recommend a brat diet while nausea and vomiting persist.  This includes bananas, rice, applesauce, and toast. As discussed, if symptoms fail to improve over the next several days, please feel free to follow-up in this office or with your primary care physician for further evaluation. Follow-up as needed.     ED Prescriptions     Medication Sig Dispense Auth. Provider   ondansetron (ZOFRAN-ODT) 4 MG disintegrating tablet Take 1 tablet (4 mg total) by mouth every 8 (eight) hours as needed. 20 tablet Ty Buntrock-Warren, Alda Lea, NP   ibuprofen (ADVIL) 800 MG tablet Take 1 tablet (800 mg total) by mouth  3 (three) times daily. 21 tablet Cletus Mehlhoff-Warren, Alda Lea, NP      PDMP not reviewed this encounter.   Tish Men, NP 01/01/23 [phone]

## 2023-01-01 NOTE — Discharge Instructions (Signed)
The urinalysis did not suggest a urinary tract infection or kidney stones.  A urine culture has been ordered.  You will be contacted if the culture results are positive. Take medication as prescribed. Increase fluids and allow for plenty of rest. May apply ice or heat as needed.  Apply ice for pain or swelling, heat for spasm or stiffness.  Apply for 20 minutes, remove 1 hour, then repeat is much as possible. Recommend a brat diet while nausea and vomiting persist.  This includes bananas, rice, applesauce, and toast. As discussed, if symptoms fail to improve over the next several days, please feel free to follow-up in this office or with your primary care physician for further evaluation. Follow-up as needed.

## 2023-01-01 NOTE — ED Triage Notes (Signed)
Sharp pains across back that radiates to left side since yesterday.  Nausea and vomiting last night.

## 2023-01-03 LAB — URINE CULTURE: Culture: 30000 — AB

## 2023-05-02 IMAGING — CR DG ABDOMEN 1V
2 series · 2 of 2 positions shown · non-contrast
Comparison: February 17, 2022

CLINICAL DATA: Lithotripsy February 18, 2022.

EXAM:
ABDOMEN - 1 VIEW

[abdomen kub (1 of 2)]
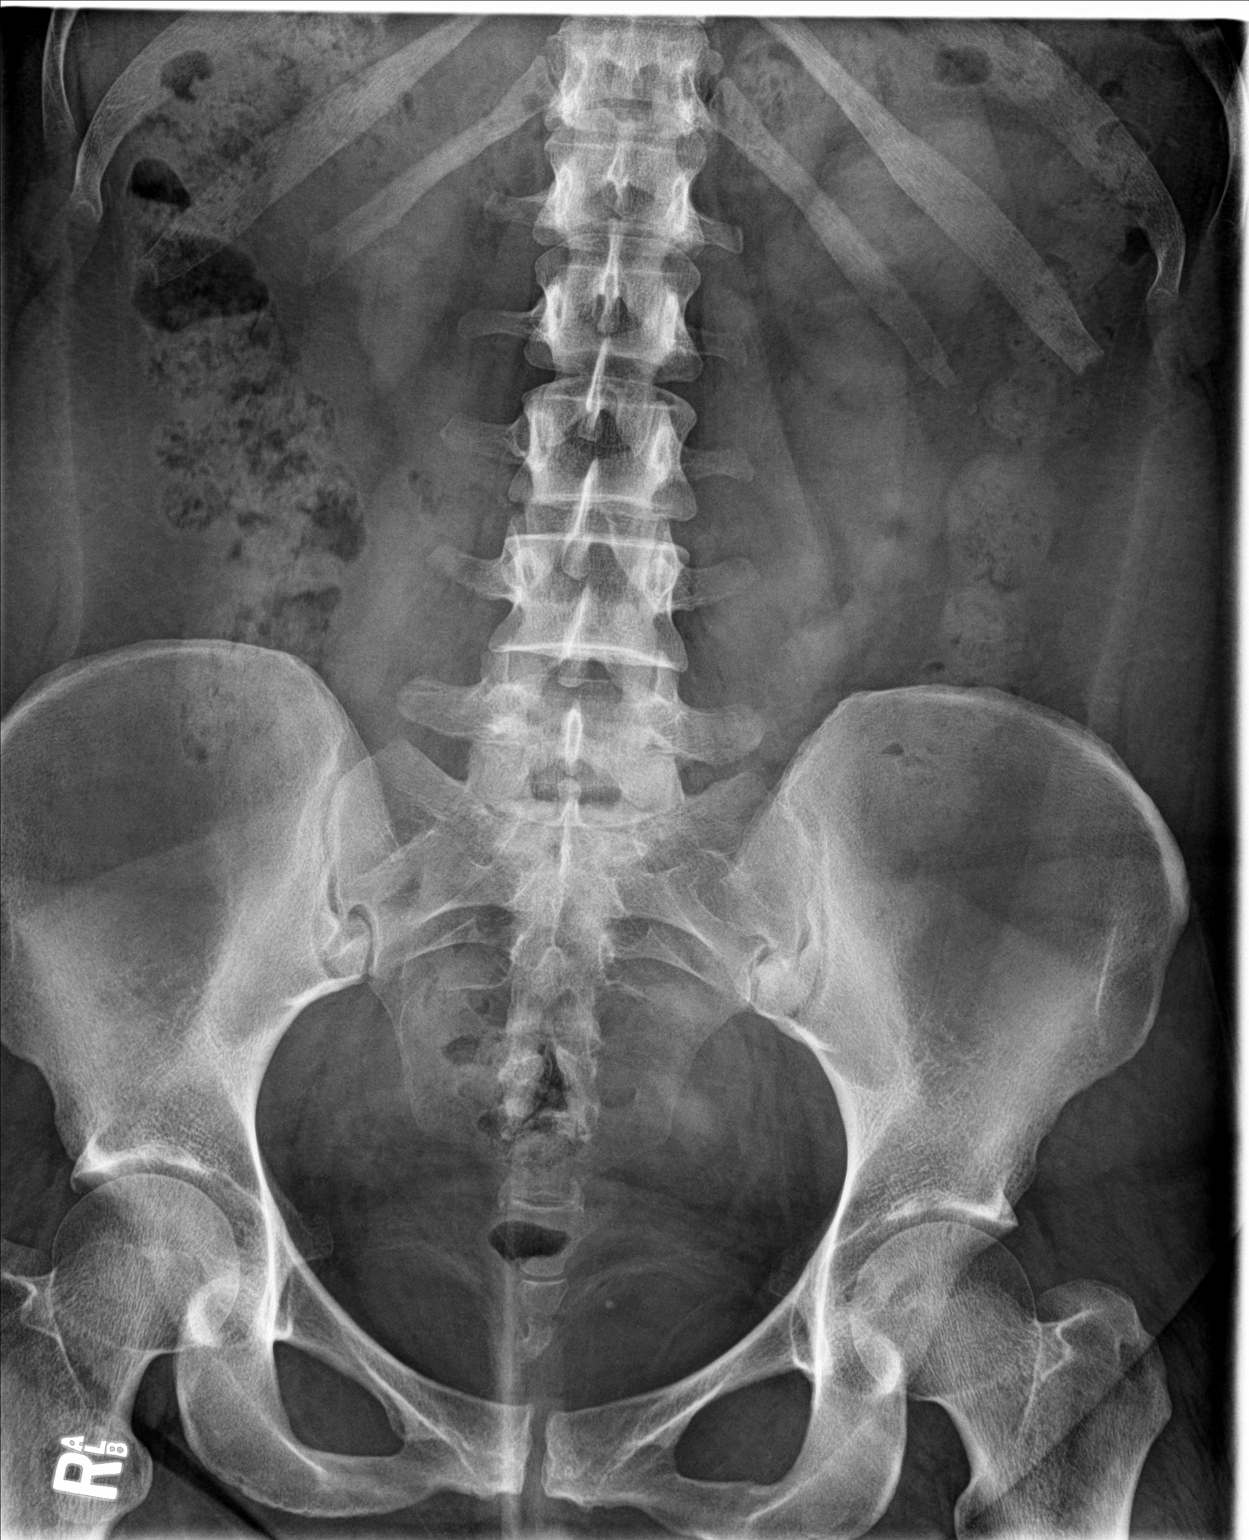

[abdomen kub (2 of 2)]
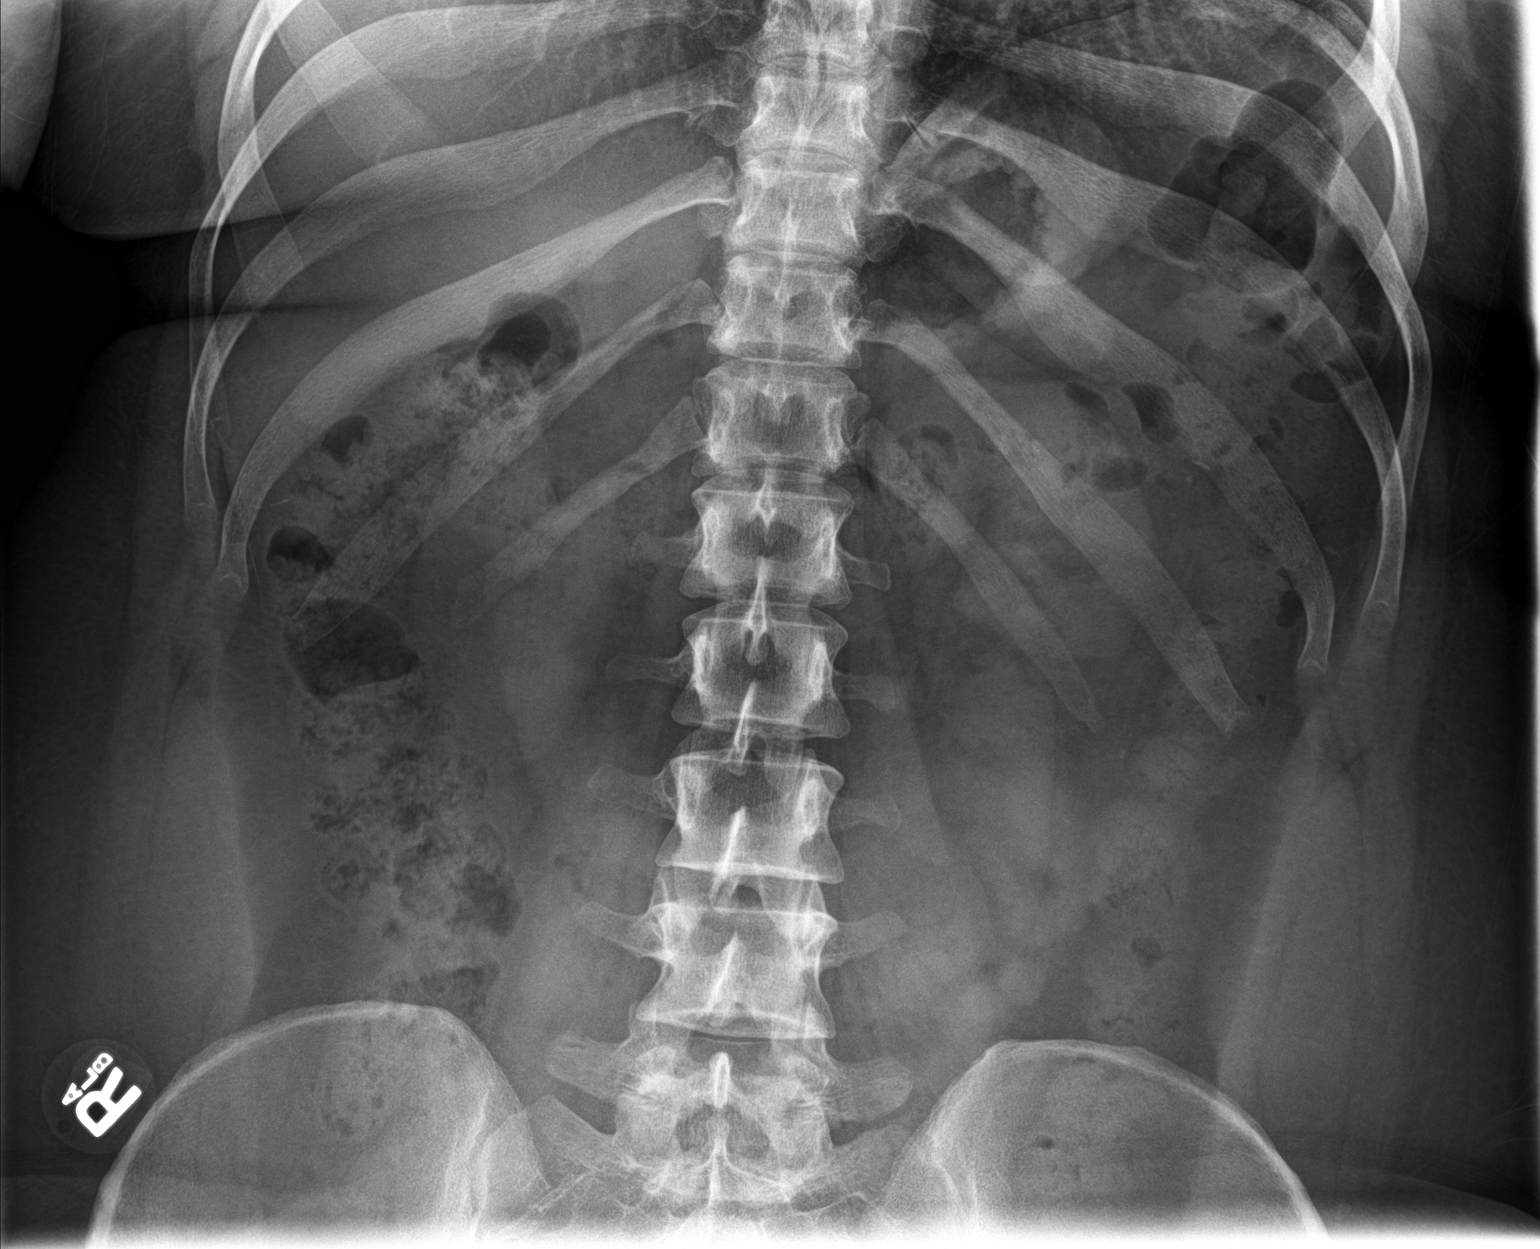

[2 of 2 positions shown; findings below may reference images not displayed]

FINDINGS: A known left pelvic phlebolith is stable since February 11, 2022. The
distal right ureteral stone identified February 17, 2022 is no longer
present. Both kidneys are partially obscured by bowel contents but
no renal stones are noted. No ureteral stones are identified. Mild
fecal loading in the colon.
IMPRESSION: The previously identified right ureteral stone is no longer present.
Mild fecal loading.

## 2023-10-06 ENCOUNTER — Ambulatory Visit (INDEPENDENT_AMBULATORY_CARE_PROVIDER_SITE_OTHER): Payer: 59

## 2023-10-06 ENCOUNTER — Ambulatory Visit
Admission: RE | Admit: 2023-10-06 | Discharge: 2023-10-06 | Disposition: A | Discharge status: Home / Self Care | Payer: 59 | Source: Ambulatory Visit | Attending: Nurse Practitioner

## 2023-10-06 VITALS — BP 118/77 | HR 82 | Temp 98.4°F | Resp 18

## 2023-10-06 DIAGNOSIS — M25531 Pain in right wrist: Secondary | ICD-10-CM

## 2023-10-06 NOTE — ED Provider Notes (Addendum)
[facility]    CSN: [id number] Arrival date & time: 10/06/23  0941      History   Chief Complaint Chief Complaint  Patient presents with   Wrist Pain    Entered by patient    HPI Kristi Rhodes is a 27 y.o. female.   The history is provided by the patient.   Patient presents for complaints of right wrist pain that developed after doing physical training at work 1 day ago.  Patient states that she was doing exercise with a another partner.  She states after the exercise, she developed pain in the right wrist.  She reports she cannot put weight on the wrist, and has pain when she moves her wrist in any direction.  Patient states that she also has a difficult time picking up objects and making a fist.  She denies numbness, tingling, bruising, or swelling.  She states that she does have pain that radiates from the wrist down into the small finger and in between the small finger and ring finger.  Patient is right-hand dominant.  Denies prior injury or trauma.  Patient states that she did notify her supervisor, and he informed her that he was completing some paperwork.  Past Medical History:  Diagnosis Date   Kidney stones     There are no active problems to display for this patient.   Past Surgical History:  Procedure Laterality Date   EXTRACORPOREAL SHOCK WAVE LITHOTRIPSY Right 02/17/2022   Procedure: EXTRACORPOREAL SHOCK WAVE LITHOTRIPSY (ESWL);  Surgeon: Sondra Come, MD;  Location: [facility];  Service: Urology;  Laterality: Right;    OB History     Gravida  0   Para  0   Term  0   Preterm  0   AB  0   Living  0      SAB  0   IAB  0   Ectopic  0   Multiple  0   Live Births  0            Home Medications    Prior to Admission medications   Medication Sig Start Date End Date Taking? Authorizing Provider  cephALEXin (KEFLEX) 500 MG capsule Take 1 capsule (500 mg total) by mouth 2 (two) times daily. 10/28/22   Particia Nearing,  PA-C  ibuprofen (ADVIL) 800 MG tablet Take 1 tablet (800 mg total) by mouth 3 (three) times daily. 01/01/23   Leath-Warren, Sadie Haber, NP  ondansetron (ZOFRAN) 4 MG tablet Take 1 tablet (4 mg total) by mouth every 4 (four) hours as needed for nausea or vomiting. 02/21/22   Vanna Scotland, MD  ondansetron (ZOFRAN-ODT) 4 MG disintegrating tablet Take 1 tablet (4 mg total) by mouth every 8 (eight) hours as needed. 01/01/23   Leath-Warren, Sadie Haber, NP    Family History Family History  Problem Relation Age of Onset   Diabetes Father    Thyroid disease Mother     Social History Social History   Tobacco Use   Smoking status: Never    Passive exposure: Never   Smokeless tobacco: Never  Vaping Use   Vaping status: Never Used  Substance Use Topics   Alcohol use: Yes   Drug use: Never     Allergies   Patient has no known allergies.   Review of Systems Review of Systems Per HPI  Physical Exam Triage Vital Signs ED Triage Vitals  Encounter Vitals Group     BP 10/06/23 1020 118/77  Systolic BP Percentile --      Diastolic BP Percentile --      Pulse Rate 10/06/23 1020 82     Resp 10/06/23 1020 18     Temp 10/06/23 1020 98.4 F (36.9 C)     Temp Source 10/06/23 1020 Oral     SpO2 10/06/23 1020 98 %     Weight --      Height --      Head Circumference --      Peak Flow --      Pain Score 10/06/23 1021 8     Pain Loc --      Pain Education --      Exclude from Growth Chart --    No data found.  Updated Vital Signs BP 118/77 (BP Location: Right Arm)   Pulse 82   Temp 98.4 F (36.9 C) (Oral)   Resp 18   LMP 09/18/2023   SpO2 98%   Visual Acuity Right Eye Distance:   Left Eye Distance:   Bilateral Distance:    Right Eye Near:   Left Eye Near:    Bilateral Near:     Physical Exam Vitals and nursing note reviewed.  Constitutional:      General: She is not in acute distress.    Appearance: Normal appearance.  HENT:     Head: Normocephalic.  Eyes:      Extraocular Movements: Extraocular movements intact.     Pupils: Pupils are equal, round, and reactive to light.  Musculoskeletal:     Right wrist: No swelling, deformity or tenderness. Normal range of motion. Normal pulse.     Comments: Pain noted along ulnar aspect of right wrist. No tenderness or bony tenderness, + FROM, pain with all movement. Decreased grip strength  Neurological:     General: No focal deficit present.     Mental Status: She is alert and oriented to person, place, and time.  Psychiatric:        Mood and Affect: Mood normal.        Behavior: Behavior normal.      UC Treatments / Results  Labs (all labs ordered are listed, but only abnormal results are displayed) Labs Reviewed - No data to display  EKG   Radiology DG Wrist Complete Right Result Date: 10/06/2023 CLINICAL DATA:  Doing physical training. Rolled on wrist yesterday. Ulnar-sided wrist pain. EXAM: RIGHT WRIST - COMPLETE 3+ VIEW COMPARISON:  None Available. FINDINGS: Neutral ulnar variance. Joint spaces are preserved. No acute fracture or dislocation. The cortices are intact. IMPRESSION: 1. No acute fracture or dislocation. 2. Neutral ulnar variance. Electronically Signed   By: Neita Garnet M.D.   On: 10/06/2023 11:08    Procedures Procedures (including critical care time)  Medications Ordered in UC Medications - No data to display  Initial Impression / Assessment and Plan / UC Course  I have reviewed the triage vital signs and the nursing notes.  Pertinent labs & imaging results that were available during my care of the patient were reviewed by me and considered in my medical decision making (see chart for details).  X-ray of the right wrist is pending.  At a minimum, suspect a right wrist sprain/strain.  Will provide wrist brace to allow for additional compression and support.  Patient was also provided exercises to perform at home.  Supportive care recommendations were provided and discussed  with the patient to include RICE therapy and over-the-counter analgesics.  Patient may plan to speak  with her employer regarding Worker's Compensation.  Patient verbalizes understanding.  All questions were answered.  Patient stable for discharge.  Received x-ray results.  Call patient to discuss results, verified 2 patient identifiers.  Advised patient that x-ray was negative for fracture or dislocation but did note "neutral ulnar variance" consistent with where she is experiencing her pain.  Patient advised to continue current treatment plan, she advised that she does understand what needs to be done to follow-up if this is Worker's Comp.  Patient verbalized understanding.  All questions were answered.  Final Clinical Impressions(s) / UC Diagnoses   Final diagnoses:  Right wrist pain     Discharge Instructions      X-ray of the right wrist is pending.  You will be contacted if the result of the x-ray is abnormal.  You also have access to the results via MyChart. You have been provided a wrist brace to allow for additional compression and support.  Wear the brace when you are engaged in prolonged or strenuous activity.  As discussed, you do need to remove the brace and perform range of motion exercises to keep the joint mobile. RICE therapy, rest, ice, compression, and elevation.  Apply ice for pain or swelling, heat for spasm or stiffness.  Apply for 20 minutes, remove for 1 hour, repeat as needed. Follow-up as needed.     ED Prescriptions   None    PDMP not reviewed this encounter.   Abran Cantor, NP 10/06/23 1058    Leath-Warren, Sadie Haber, NP 10/06/23 1154

## 2023-10-06 NOTE — ED Triage Notes (Signed)
Pt reports while doing physical training at work she began to have right wrist pain x 1 day. States she was doing some rolling around with a partner on the job. Cannot put weight on her right wrist and states the pain radiate from her pinky down to her wrist.

## 2023-10-06 NOTE — Discharge Instructions (Addendum)
X-ray of the right wrist is pending.  You will be contacted if the result of the x-ray is abnormal.  You also have access to the results via MyChart. You have been provided a wrist brace to allow for additional compression and support.  Wear the brace when you are engaged in prolonged or strenuous activity.  As discussed, you do need to remove the brace and perform range of motion exercises to keep the joint mobile. RICE therapy, rest, ice, compression, and elevation.  Apply ice for pain or swelling, heat for spasm or stiffness.  Apply for 20 minutes, remove for 1 hour, repeat as needed. Follow-up as needed.
